# Patient Record
Sex: Male | Born: 1950 | Race: White | Hispanic: No | Marital: Married | Smoking: Never smoker
Health system: Southern US, Community
[De-identification: ages and names within clinical notes are randomized; demographics above are authoritative.]

## PROBLEM LIST (undated history)

## (undated) DIAGNOSIS — C801 Malignant (primary) neoplasm, unspecified: Secondary | ICD-10-CM

## (undated) DIAGNOSIS — E119 Type 2 diabetes mellitus without complications: Secondary | ICD-10-CM

## (undated) DIAGNOSIS — Z972 Presence of dental prosthetic device (complete) (partial): Secondary | ICD-10-CM

## (undated) DIAGNOSIS — I509 Heart failure, unspecified: Secondary | ICD-10-CM

## (undated) DIAGNOSIS — E7800 Pure hypercholesterolemia, unspecified: Secondary | ICD-10-CM

## (undated) DIAGNOSIS — I208 Other forms of angina pectoris: Secondary | ICD-10-CM

## (undated) DIAGNOSIS — G629 Polyneuropathy, unspecified: Secondary | ICD-10-CM

## (undated) DIAGNOSIS — I2089 Other forms of angina pectoris: Secondary | ICD-10-CM

## (undated) DIAGNOSIS — E039 Hypothyroidism, unspecified: Secondary | ICD-10-CM

## (undated) DIAGNOSIS — I10 Essential (primary) hypertension: Secondary | ICD-10-CM

## (undated) DIAGNOSIS — I4892 Unspecified atrial flutter: Secondary | ICD-10-CM

## (undated) DIAGNOSIS — R519 Headache, unspecified: Secondary | ICD-10-CM

## (undated) DIAGNOSIS — G4730 Sleep apnea, unspecified: Secondary | ICD-10-CM

## (undated) DIAGNOSIS — I219 Acute myocardial infarction, unspecified: Secondary | ICD-10-CM

## (undated) DIAGNOSIS — I2510 Atherosclerotic heart disease of native coronary artery without angina pectoris: Secondary | ICD-10-CM

## (undated) DIAGNOSIS — I499 Cardiac arrhythmia, unspecified: Secondary | ICD-10-CM

## (undated) DIAGNOSIS — H9190 Unspecified hearing loss, unspecified ear: Secondary | ICD-10-CM

## (undated) DIAGNOSIS — K219 Gastro-esophageal reflux disease without esophagitis: Secondary | ICD-10-CM

## (undated) HISTORY — PX: TONSILLECTOMY: SUR1361

## (undated) HISTORY — PX: CHOLECYSTECTOMY: SHX55

## (undated) HISTORY — PX: APPENDECTOMY: SHX54

## (undated) HISTORY — PX: COLON SURGERY: SHX602

## (undated) HISTORY — PX: KNEE ARTHROSCOPY: SUR90

---
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
## 1992-02-12 DIAGNOSIS — C801 Malignant (primary) neoplasm, unspecified: Secondary | ICD-10-CM

## 1992-02-12 HISTORY — PX: COLON RESECTION: SHX5231

## 1992-02-12 HISTORY — DX: Malignant (primary) neoplasm, unspecified: C80.1

## 2005-02-11 DIAGNOSIS — I219 Acute myocardial infarction, unspecified: Secondary | ICD-10-CM

## 2005-02-11 HISTORY — DX: Acute myocardial infarction, unspecified: I21.9

## 2005-05-29 ENCOUNTER — Ambulatory Visit: Payer: Self-pay

## 2005-05-31 ENCOUNTER — Ambulatory Visit: Payer: Self-pay

## 2005-06-04 ENCOUNTER — Emergency Department: Payer: Self-pay | Admitting: Emergency Medicine

## 2005-06-25 ENCOUNTER — Ambulatory Visit: Payer: Self-pay | Admitting: Surgery

## 2005-10-18 ENCOUNTER — Ambulatory Visit (HOSPITAL_COMMUNITY): Admission: RE | Admit: 2005-10-18 | Discharge: 2005-10-18 | Payer: Self-pay | Admitting: Orthopedic Surgery

## 2007-01-22 ENCOUNTER — Inpatient Hospital Stay (HOSPITAL_COMMUNITY): Admission: EM | Admit: 2007-01-22 | Discharge: 2007-02-02 | Payer: Self-pay | Admitting: Emergency Medicine

## 2007-02-03 ENCOUNTER — Inpatient Hospital Stay (HOSPITAL_COMMUNITY): Admission: EM | Admit: 2007-02-03 | Discharge: 2007-02-16 | Payer: Self-pay | Admitting: Emergency Medicine

## 2008-09-10 IMAGING — CR DG SMALL BOWEL
8 series · 8 of 8 positions shown · non-contrast
Comparison: none

CLINICAL DATA: 56-year-old male with history of nausea and vomiting, and distended bowel loops.  Prior abdominal surgery and partial bowel resection.
SMALL BOWEL STUDY:

[view not recorded (1 of 8)]
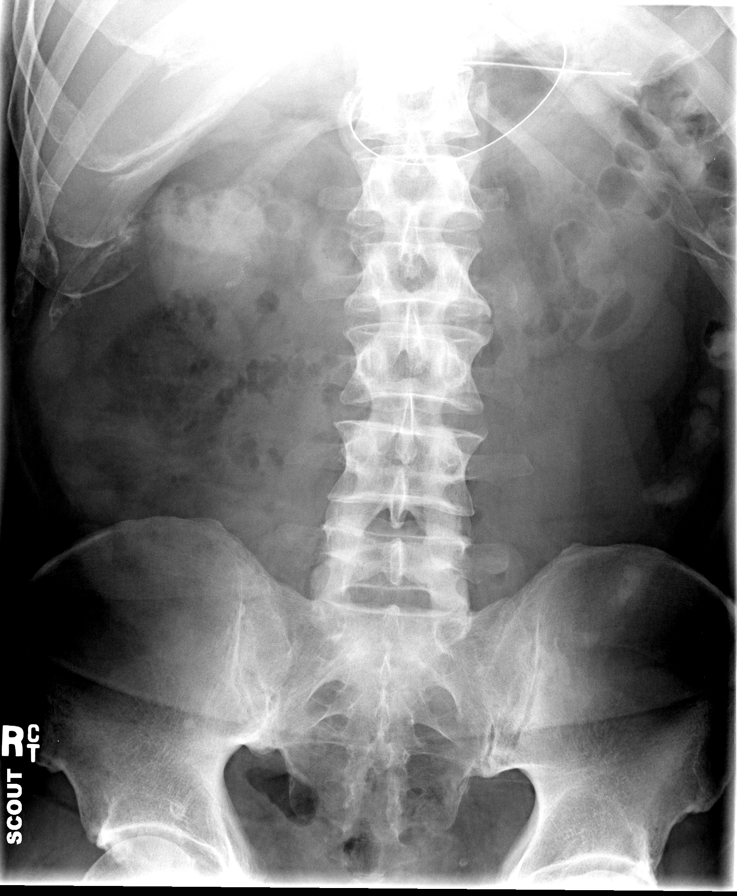

[view not recorded (2 of 8)]
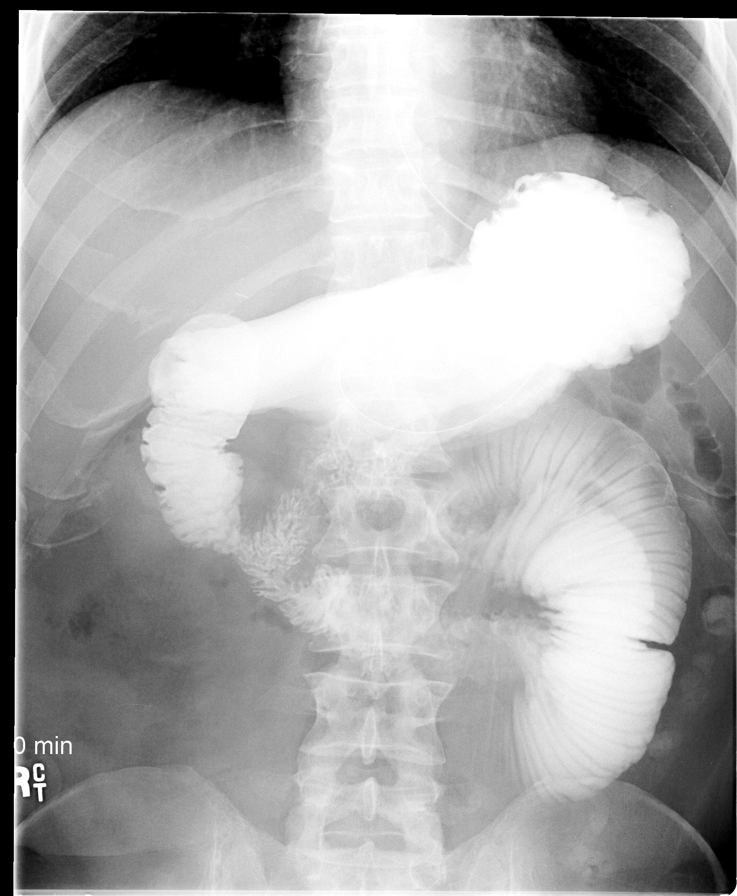

[view not recorded (3 of 8)]
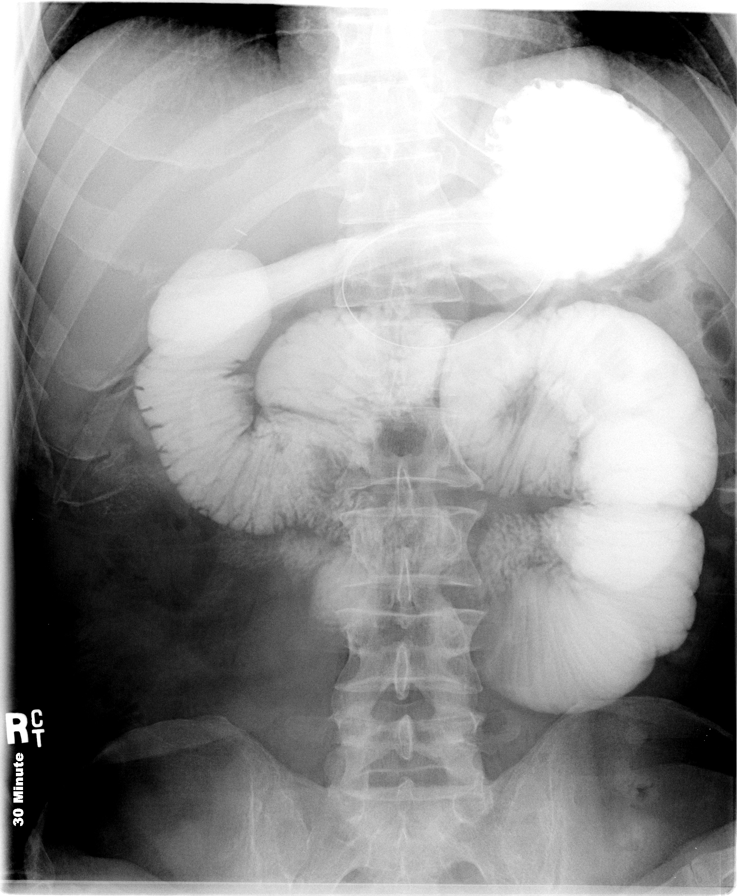

[view not recorded (4 of 8)]
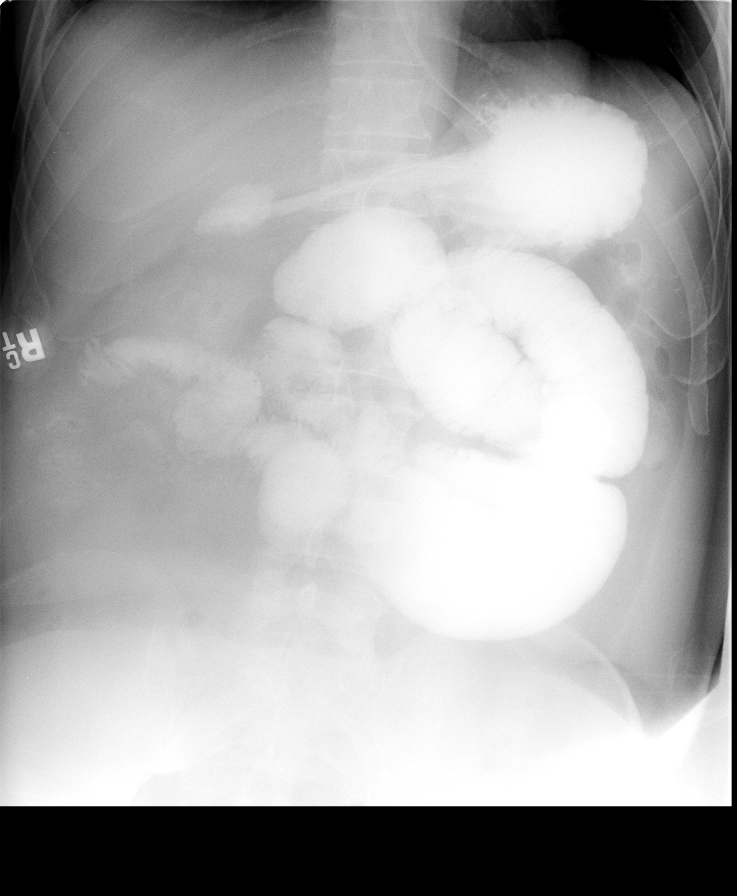

[view not recorded (5 of 8)]
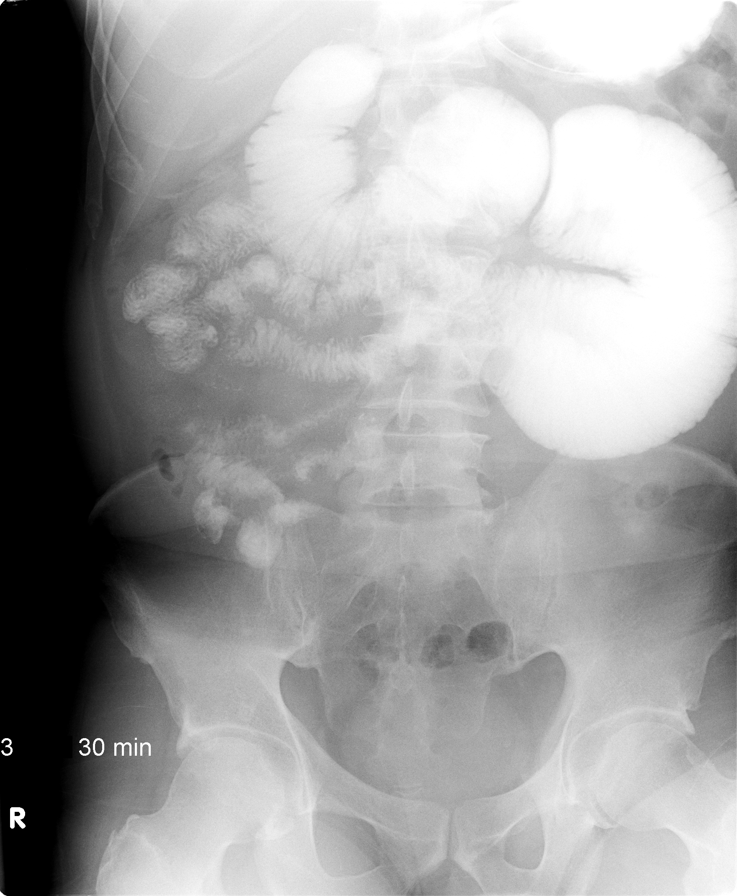

[view not recorded (6 of 8)]
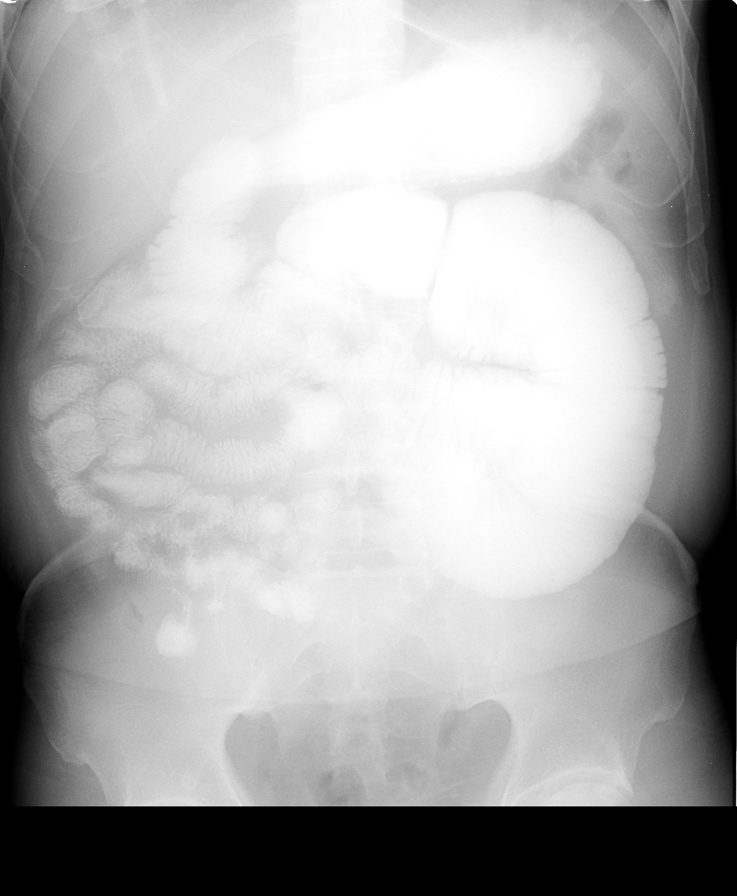

[view not recorded (7 of 8)]
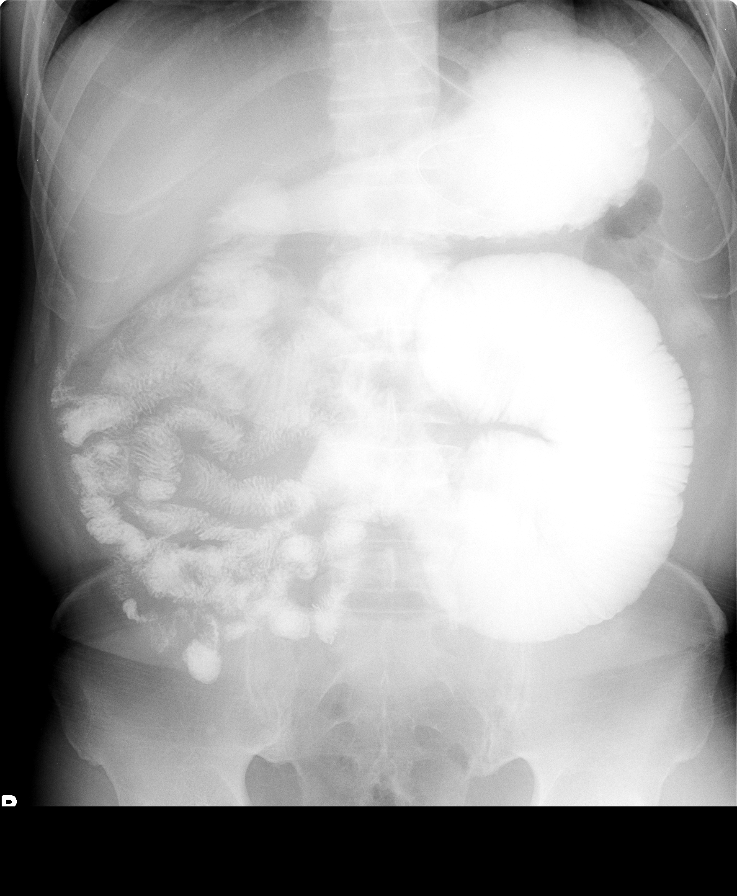

[view not recorded (8 of 8)]
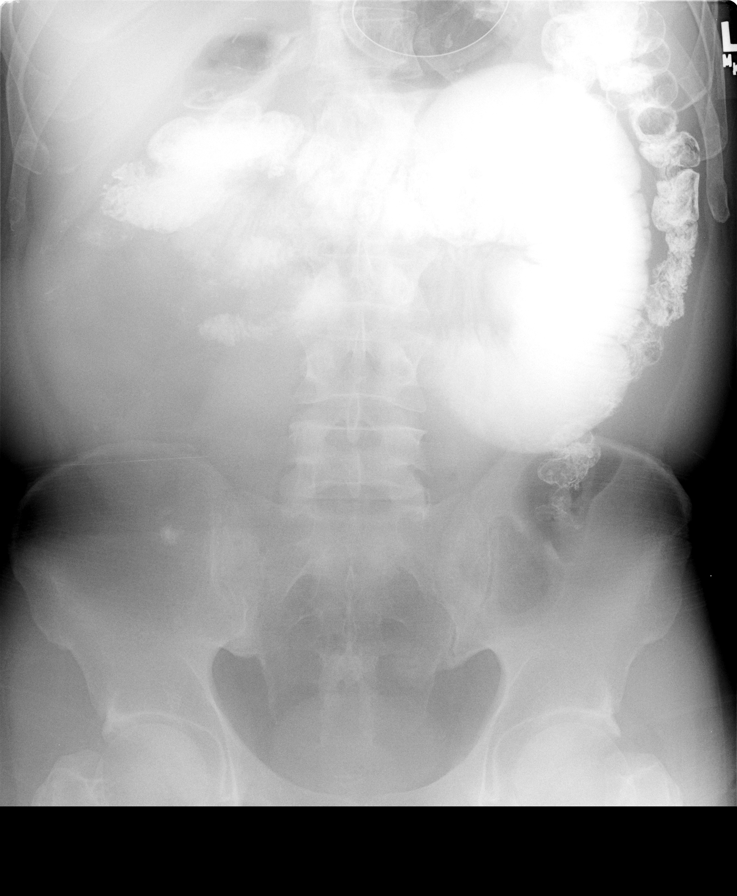

[8 of 8 positions shown; findings below may reference images not displayed]

FINDINGS: Combination of thin barium and Enterovue were administered through the patient?s NG tube as the patient was not able to tolerate oral intake.  The stomach is unremarkable.  There are dilated loops of proximal small bowel.  Slow transit through the small bowel noted but normal caliber mid and distal small bowel is present.  This is compatible with a partial small bowel obstruction.  Small amount of contrast reaches the colon by the 7-hour film.   An additional portable abdomen will be obtained at 11-hour interval.
IMPRESSION: Dilated proximal small bowel with collapsed mid and distal small bowel loops compatible with partial small bowel obstruction.

## 2008-12-16 ENCOUNTER — Inpatient Hospital Stay (HOSPITAL_COMMUNITY): Admission: EM | Admit: 2008-12-16 | Discharge: 2008-12-19 | Payer: Self-pay | Admitting: Internal Medicine

## 2008-12-16 ENCOUNTER — Encounter: Payer: Self-pay | Admitting: Emergency Medicine

## 2009-09-08 ENCOUNTER — Encounter: Payer: Self-pay | Admitting: Emergency Medicine

## 2009-09-27 ENCOUNTER — Encounter (HOSPITAL_COMMUNITY): Admission: RE | Admit: 2009-09-27 | Discharge: 2009-10-18 | Payer: Self-pay | Admitting: Cardiology

## 2010-01-01 ENCOUNTER — Encounter (HOSPITAL_COMMUNITY)
Admission: RE | Admit: 2010-01-01 | Discharge: 2010-03-13 | Payer: Self-pay | Source: Home / Self Care | Attending: Cardiology | Admitting: Cardiology

## 2010-01-18 ENCOUNTER — Inpatient Hospital Stay (HOSPITAL_COMMUNITY): Admission: EM | Admit: 2010-01-18 | Discharge: 2009-09-12 | Payer: Self-pay | Admitting: Cardiology

## 2010-04-27 LAB — CBC
HCT: 42.4 % (ref 39.0–52.0)
HCT: 44.4 % (ref 39.0–52.0)
Hemoglobin: 15.3 g/dL (ref 13.0–17.0)
Hemoglobin: 15.4 g/dL (ref 13.0–17.0)
MCH: 30.8 pg (ref 26.0–34.0)
MCH: 31 pg (ref 26.0–34.0)
MCHC: 36.1 g/dL — ABNORMAL HIGH (ref 30.0–36.0)
MCV: 89.4 fL (ref 78.0–100.0)
Platelets: 132 10*3/uL — ABNORMAL LOW (ref 150–400)
RBC: 4.97 MIL/uL (ref 4.22–5.81)
RBC: 4.97 MIL/uL (ref 4.22–5.81)

## 2010-04-27 LAB — BASIC METABOLIC PANEL
CO2: 28 mEq/L (ref 19–32)
Chloride: 107 mEq/L (ref 96–112)
GFR calc Af Amer: >60 mL/min (ref >60)
Glucose, Bld: 155 mg/dL — ABNORMAL HIGH (ref 70–99)
Potassium: 4.3 mEq/L (ref 3.5–5.1)
Sodium: 140 mEq/L (ref 135–145)

## 2010-04-27 LAB — GLUCOSE, CAPILLARY
Glucose-Capillary: 146 mg/dL — ABNORMAL HIGH (ref 70–99)
Glucose-Capillary: 156 mg/dL — ABNORMAL HIGH (ref 70–99)
Glucose-Capillary: 162 mg/dL — ABNORMAL HIGH (ref 70–99)

## 2010-04-28 LAB — CBC
HCT: 40.5 % (ref 39.0–52.0)
HCT: 42.5 % (ref 39.0–52.0)
Hemoglobin: 13.5 g/dL (ref 13.0–17.0)
Hemoglobin: 14.5 g/dL (ref 13.0–17.0)
MCH: 30.2 pg (ref 26.0–34.0)
MCH: 30.6 pg (ref 26.0–34.0)
MCHC: 33.5 g/dL (ref 30.0–36.0)
MCHC: 34.2 g/dL (ref 30.0–36.0)
MCV: 88.3 fL (ref 78.0–100.0)
MCV: 89.4 fL (ref 78.0–100.0)
Platelets: 136 10*3/uL — ABNORMAL LOW (ref 150–400)
RBC: 5.06 MIL/uL (ref 4.22–5.81)
RBC: 4.48 MIL/uL (ref 4.22–5.81)
RDW: 13.8 % (ref 11.5–15.5)
RDW: 14.1 % (ref 11.5–15.5)
WBC: 4.8 10*3/uL (ref 4.0–10.5)
WBC: 6.1 10*3/uL (ref 4.0–10.5)
WBC: 6.7 10*3/uL (ref 4.0–10.5)

## 2010-04-28 LAB — COMPREHENSIVE METABOLIC PANEL
ALT: 26 U/L (ref 0–53)
AST: 84 U/L — ABNORMAL HIGH (ref 0–37)
Alkaline Phosphatase: 54 U/L (ref 39–117)
BUN: 16 mg/dL (ref 6–23)
CO2: 28 mEq/L (ref 19–32)
CO2: 27 mEq/L (ref 19–32)
Calcium: 9.1 mg/dL (ref 8.4–10.5)
Calcium: 8.9 mg/dL (ref 8.4–10.5)
Chloride: 99 mEq/L (ref 96–112)
Creatinine, Ser: 1.02 mg/dL (ref 0.4–1.5)
GFR calc Af Amer: >60 mL/min (ref >60)
GFR calc Af Amer: >60 mL/min (ref >60)
GFR calc non Af Amer: >60 mL/min (ref >60)
GFR calc non Af Amer: >60 mL/min (ref >60)
Potassium: 4.1 mEq/L (ref 3.5–5.1)
Sodium: 137 mEq/L (ref 135–145)
Total Bilirubin: 0.4 mg/dL (ref 0.3–1.2)

## 2010-04-28 LAB — CARDIAC PANEL(CRET KIN+CKTOT+MB+TROPI)
CK, MB: 109.7 ng/mL (ref 0.3–4.0)
Relative Index: 10.8 — ABNORMAL HIGH (ref 0.0–2.5)
Relative Index: 11.4 — ABNORMAL HIGH (ref 0.0–2.5)
Total CK: 847 U/L — ABNORMAL HIGH (ref 7–232)
Troponin I: 13.58 ng/mL (ref 0.00–0.06)

## 2010-04-28 LAB — DIFFERENTIAL
Basophils Absolute: 0 10*3/uL (ref 0.0–0.1)
Lymphocytes Relative: 24 % (ref 12–46)
Lymphs Abs: 1.2 10*3/uL (ref 0.7–4.0)
Neutro Abs: 2.8 10*3/uL (ref 1.7–7.7)

## 2010-04-28 LAB — PROTIME-INR
INR: 1.12 (ref 0.00–1.49)
INR: 1.18 (ref 0.00–1.49)
Prothrombin Time: 14.3 seconds (ref 11.6–15.2)

## 2010-04-28 LAB — GLUCOSE, CAPILLARY
Glucose-Capillary: 252 mg/dL — ABNORMAL HIGH (ref 70–99)
Glucose-Capillary: 118 mg/dL — ABNORMAL HIGH (ref 70–99)
Glucose-Capillary: 173 mg/dL — ABNORMAL HIGH (ref 70–99)
Glucose-Capillary: 185 mg/dL — ABNORMAL HIGH (ref 70–99)

## 2010-04-28 LAB — APTT
aPTT: 38 seconds — ABNORMAL HIGH (ref 24–37)
aPTT: 46 seconds — ABNORMAL HIGH (ref 24–37)

## 2010-04-28 LAB — POCT CARDIAC MARKERS
CKMB, poc: 1.8 ng/mL (ref 1.0–8.0)
Myoglobin, poc: 131 ng/mL (ref 12–200)

## 2010-04-28 LAB — TROPONIN I: Troponin I: 0.02 ng/mL (ref 0.00–0.06)

## 2010-04-28 LAB — CK TOTAL AND CKMB (NOT AT ARMC)
Relative Index: 1.8 (ref 0.0–2.5)
Total CK: 216 U/L (ref 7–232)

## 2010-04-28 LAB — LIPID PANEL
HDL: 32 mg/dL — ABNORMAL LOW (ref >39)
LDL Cholesterol: UNDETERMINED mg/dL (ref 0–99)
Triglycerides: 410 mg/dL — ABNORMAL HIGH (ref <150)
VLDL: UNDETERMINED mg/dL (ref 0–40)

## 2010-04-28 LAB — HEPARIN LEVEL (UNFRACTIONATED): Heparin Unfractionated: 0.56 IU/mL (ref 0.30–0.70)

## 2010-04-28 LAB — MRSA PCR SCREENING: MRSA by PCR: NEGATIVE

## 2010-05-16 LAB — URINALYSIS, ROUTINE W REFLEX MICROSCOPIC
Bilirubin Urine: NEGATIVE
Glucose, UA: NEGATIVE mg/dL
Nitrite: NEGATIVE
Specific Gravity, Urine: 1.025 (ref 1.005–1.030)
pH: 6 (ref 5.0–8.0)

## 2010-05-16 LAB — DIFFERENTIAL
Basophils Relative: 0 % (ref 0–1)
Eosinophils Absolute: 0 10*3/uL (ref 0.0–0.7)
Neutrophils Relative %: 86 % — ABNORMAL HIGH (ref 43–77)

## 2010-05-16 LAB — COMPREHENSIVE METABOLIC PANEL
ALT: 26 U/L (ref 0–53)
Alkaline Phosphatase: 65 U/L (ref 39–117)
CO2: 25 mEq/L (ref 19–32)
GFR calc non Af Amer: >60 mL/min (ref >60)
Glucose, Bld: 181 mg/dL — ABNORMAL HIGH (ref 70–99)
Potassium: 3.9 mEq/L (ref 3.5–5.1)
Sodium: 136 mEq/L (ref 135–145)
Total Bilirubin: 0.7 mg/dL (ref 0.3–1.2)

## 2010-05-16 LAB — BASIC METABOLIC PANEL
GFR calc non Af Amer: >60 mL/min (ref >60)
Glucose, Bld: 140 mg/dL — ABNORMAL HIGH (ref 70–99)
Potassium: 3.7 mEq/L (ref 3.5–5.1)
Sodium: 137 mEq/L (ref 135–145)

## 2010-05-16 LAB — GLUCOSE, CAPILLARY
Glucose-Capillary: 108 mg/dL — ABNORMAL HIGH (ref 70–99)
Glucose-Capillary: 109 mg/dL — ABNORMAL HIGH (ref 70–99)
Glucose-Capillary: 114 mg/dL — ABNORMAL HIGH (ref 70–99)
Glucose-Capillary: 120 mg/dL — ABNORMAL HIGH (ref 70–99)
Glucose-Capillary: 121 mg/dL — ABNORMAL HIGH (ref 70–99)
Glucose-Capillary: 128 mg/dL — ABNORMAL HIGH (ref 70–99)
Glucose-Capillary: 138 mg/dL — ABNORMAL HIGH (ref 70–99)
Glucose-Capillary: 155 mg/dL — ABNORMAL HIGH (ref 70–99)
Glucose-Capillary: 187 mg/dL — ABNORMAL HIGH (ref 70–99)

## 2010-05-16 LAB — LIPASE, BLOOD: Lipase: 23 U/L (ref 11–59)

## 2010-05-16 LAB — CBC
HCT: 40.8 % (ref 39.0–52.0)
Hemoglobin: 16.3 g/dL (ref 13.0–17.0)
Hemoglobin: 14.4 g/dL (ref 13.0–17.0)
RBC: 5.22 MIL/uL (ref 4.22–5.81)
RBC: 4.54 MIL/uL (ref 4.22–5.81)
RDW: 13.6 % (ref 11.5–15.5)
WBC: 6.8 10*3/uL (ref 4.0–10.5)

## 2010-06-26 NOTE — H&P (Signed)
NAMEJOHNAVON, MCCLAFFERTY                 ACCOUNT NO.:  [id number]   MEDICAL RECORD NO.:  [id number]          PATIENT TYPE:  INP   LOCATION:  A302                          FACILITY:  [facility]   PHYSICIAN:  Tilford Pillar, MD      DATE OF BIRTH:  06-28-1950   DATE OF ADMISSION:  01/22/2007  DATE OF DISCHARGE:  LH                              HISTORY & PHYSICAL   ADMITTING DIAGNOSES:  Abdominal pain, nausea and vomiting.   HISTORY OF PRESENT ILLNESS:  The patient is a 60 year old male with a  history of diabetes mellitus, hypothyroidism, and a history of leukemia  at which time he had a previous bowel perforation which appears to be  colonic in nature which required operative resection and intervention.  This was approximately 14 years ago.  He has had no difficulties or  symptomatology since then.  He does state he had an episode of colicky  abdominal pain recently but this improved spontaneously.  Today, he  states that this morning he has had increasing insidious  onset of  crampy diffuse abdominal pain which progressed and became overt nausea  and vomiting.  This was not bloody.  He had additional crampy abdominal  pain and this was not resolving.  He did say he had a small bowel  movement this morning but it was significantly smaller in volume than  normal.  He denied any hematochezia or melena with the bowel movement.  He denies any fever or chills.   PAST MEDICAL HISTORY:  1. Diabetes mellitus, type 2.  2. Hypothyroidism.  3. Hypercholesterolemia.   PAST SURGICAL HISTORY:  1. Appendectomy.  2. Bowel resection following perforation.  3. Cholecystectomy.  4. Prior knee surgery.   MEDICATIONS:  1. Metformin 500 mg p.o. b.i.d.  2. Synthroid 0.125 mcg p.o. daily.  3. Additionally, he states he is on two hypercholesterolemia      medications, unsure of the names.   ALLERGIES:  1. PENICILLIN.  2. REGLAN.  3. COMPAZINE.  4. MORPHINE.  5. NIACIN.  6. SULFA MEDICATIONS.   SOCIAL  HISTORY:  No tobacco use, no alcohol use, no recreational drug  use.   REVIEW OF SYSTEMS:  No recent weight loss, weight gain.  No headaches.  No vision changes.  No shortness of breath.  No chest pain.  GI:  As per  above.  SKIN:  No rashes.  The patient states he has had a recent sinus  infection which was treated with Levaquin, which also was covering an  upper respiratory tract infection that the patient was recently  diagnosed with.   PHYSICAL EXAMINATION:  VITAL SIGNS:  He is afebrile.  Vital signs are  stable in the emergency department.  Not tachycardic.  GENERAL:  He is in no acute distress.  He is alert and oriented x3.  HEENT:  Pupils are equal, round, and reactive.  Extraocular movements  are intact.  No conjunctival pallor is noted.  NECK:  No cervical lymphadenopathy.  Trachea is midline.  PULMONARY:  He has unlabored respirations.  Clear to auscultation.  CARDIOVASCULAR:  Regular  rate and rhythm.  No murmurs, gallops, or rubs  are apparent.  Radial pulses 2 plus bilaterally.  ABDOMEN:  Abdomen is soft, flat.  He has a mild diffuse tenderness.  No  peritoneal signs.  The tenderness is greater in the lower quadrants more  so than the upper quadrants.  He has no hernias appreciated.  No masses.  EXTREMITIES:  Warm and dry.   PERTINENT LABORATORY AND RADIOGRAPHIC STUDIES:  CBC:  White blood cell  count 10.4, hemoglobin 17.2, hematocrit 49.9, platelets 229.  Basic  metabolic panel:  Sodium 135, potassium 4.5, chloride 92, bicarb 26, BUN  14, creatinine 1.13, blood glucose 234.  Urinalysis is negative (normal  limits).  CT of the abdomen and pelvis demonstrates no free air, no free  fluid.  He does have proximal small bowel dilatation.  Distal bowel is  decompressed.  No masses are apparent.   ASSESSMENT:  Partial small bowel obstruction.   PLAN:  As the patient has had bowel function this morning, this a  partial small bowel obstruction.  He will continued to be monitored  with  bowel rest, NG tube by decompression, keep him on an NPO status and he  will be evaluated closely for resolution of his symptoms.  Signs and  symptoms of worsening including increasing abdominal pain, increasing  nausea and vomiting, fever, or increasing white count were discussed  with the patient as reasons to go the operating room for exploratory  laparotomy with expected lysis of adhesions possible bowel resection.  However, at this time we will continue to monitor him and manage him on  a conservative basis.  The patient is comfortable with this plan and we  will continue to monitor the patient as an inpatient in [facility].      Tilford Pillar, MD  Electronically Signed     BZ/MEDQ  D:  01/22/2007  T:  01/23/2007  Job:  [id number]   cc:   Ninfa Linden, Dr.

## 2010-06-26 NOTE — H&P (Signed)
Jimmy Barry, Jimmy Barry                 ACCOUNT NO.:  [id number]   MEDICAL RECORD NO.:  [id number]          PATIENT TYPE:  INP   LOCATION:  A201                          FACILITY:  [facility]   PHYSICIAN:  Tilford Pillar, MD      DATE OF BIRTH:  01/23/1951   DATE OF ADMISSION:  02/03/2007  DATE OF DISCHARGE:  LH                              HISTORY & PHYSICAL   CHIEF COMPLAINT:  Nausea and vomiting.   HISTORY OF PRESENT ILLNESS:  The patient is an unfortunate 60 year old  male with history of hypothyroidism, diabetes mellitus type 2, and  hypercholesterolemia, who was discharged just a little over 24 hours  prior after extensive lysis of adhesions for a partial small bowel  obstruction. At this time, he returns for symptomatology somewhat to his  previous admission with nausea and vomiting and decreased flatus or  bowel movement. He states he did have a bowel movement, which was mostly  liquid this morning. However, has had increasing abdominal distention  since that time. He has described it as diffuse, crampy abdominal pain  with no revealing features. No fevers and chills. No drainage from his  incision.   PAST MEDICAL HISTORY:  1. Hypothyroidism.  2. Diabetes mellitus type 2.  3. Hypercholesterolemia.   PAST SURGICAL HISTORY:  1. Appendectomy.  2. Colon resection.  3. Lysis of adhesion on last admission. This was the only surgery      performed on the last admission.   MEDICATIONS:  Synthroid, Metformin, and he is on 2 medications for  hypercholesterolemia.   ALLERGIES:  PENICILLIN, REGLAN, COMPAZINE, MORPHINE, NIACIN.   SOCIAL HISTORY:  No tobacco, no alcohol use.   REVIEW OF SYSTEMS:  Unremarkable.   PHYSICAL EXAMINATION:  GENERAL:  No acute distress. He is alert and  oriented x3.  HEENT:  Pupils are equal, round, and reactive. Extraocular movements are  intact. No cervical lymphadenopathy is apparent.  PULMONARY:  Clear to auscultation. He has unlabored respirations.  CARDIOVASCULAR:  Regular rate and rhythm. No murmur, rub, or gallop  apparent.  ABDOMEN:  Diminished bowel sounds. He has a soft, moderately distended  abdomen. Mild diffuse tenderness on palpation. No peritoneal signs are  appreciated. No hernias or masses are apparent. Incision is intact.  Staples are present. No erythema is noted around the incision. No  discharge is noted.   LABORATORY DATA:  White blood cell count 9.8. Hemoglobin and hematocrit  13.8 and 40.5. Platelets 314,000. Basic metabolic panel:  Sodium 129,  potassium 3.9, chloride 296, bicarb 23, BUN 4, creatinine 1.05, blood  sugars 202, calcium 8.1.   CT of the abdomen and pelvis demonstrated no free air. There was a small  collection of mesenteric fluid in the right lower quadrant. There are  several loops of edematous loops of small bowel within the right lower  quadrant. No evidence of any transition point. No evidence of any  abscess. Otherwise, unremarkable CT evaluation of the abdomen.   ASSESSMENT/PLAN:  I suspect continued postoperative ileus at this time  and it appears that the patient continues to have symptoms consistent  with a persistent ileus. No evidence of a true bowel obstruction seen on  the CT evaluation and as the patient has had continued bowel function  although minimal, over the last 24 to 48 hours, especially within the  last 12 hours, it is advised that he be re-admitted for continued bowel  rest. He will be resumed on intravenous fluid hydration. It was  discussed that should he continue to have increased nausea, refractory  to Phenergan or Zofran, that it was recommended that an NG tube be  replaced for gastric decompression. At this point, the narcotic pain  medications will be minimized to minimize the chance of narcotic ileus.  At this time, he will be admitted for continued monitoring and  observation.      Tilford Pillar, MD  Electronically Signed     BZ/MEDQ  D:  02/03/2007  T:   02/03/2007  Job:  [id number]

## 2010-06-26 NOTE — Discharge Summary (Signed)
Jimmy Barry, Jimmy Barry                 ACCOUNT NO.:  [id number]   MEDICAL RECORD NO.:  [id number]          PATIENT TYPE:  INP   LOCATION:  A210                          FACILITY:  [facility]   PHYSICIAN:  Tilford Pillar, MD      DATE OF BIRTH:  [birth year]   DATE OF ADMISSION:  01/22/2007  DATE OF DISCHARGE:  12/22/2008LH                               DISCHARGE SUMMARY   ADMISSION DIAGNOSIS:  Small bowel obstruction.   DISCHARGE DIAGNOSES:  1. Status post small bowel obstruction requiring intraoperative lysis      of adhesions.  2. Diabetes mellitus, type 2.  3. Hypothyroidism.  4. Hypercholesterolemia.   ADMITTING SURGEON:  Tilford Pillar, M.D.   PROCEDURE:  Exploratory laparotomy with extensive enterolysis on  January 26, 2007.   DISPOSITION:  To home.   BRIEF HISTORY AND PHYSICAL:  Please see admission history and physical  for the complete H&P.  The patient is a 60 year old male with multiple  prior operations who presented to [facility] with increasing  nausea and vomiting and abdominal pain.  During evaluation in the  emergency department, it was determined that the patient likely had a  partial small bowel obstruction as he did have bowel function on the  morning of his admission.  He was admitted for conservative management  and continued monitoring.   HOSPITAL COURSE:  The patient was admitted on January 22, 2007.  It was  discussed with the patient that conservative management would be  undertaken, unless he had increasing pain, white count, fever, or no  significant progression of his symptomatology.  He continued to have  intermittent episodes of flatus, however, no bowel function during his  first couple of hospital days.  He was continued on IV fluid hydration  and NG tube gastric decompression.  On hospital day #3, he complained of  no episodes of flatus.  His bowel sounds were absent and his abdomen  remained distended despite having NG tube decompression.  At  this point  it was discussed with the patient the need for exploratory laparotomy  with suspected extensive lysis of adhesions.  He was taken to the  operating room on January 26, 2007.  He underwent the above mentioned  exploratory laparotomy with extensive lysis of adhesions.  He tolerated  the procedure well.  He was monitored following this on the telemetry  floor due to some cardiac changes noted on induction of anesthetic.  However, he continued to be asymptomatic without any notable changes in  his telemetry monitoring.  At this point he was continued on conservative management with limited  p.o.  He did have slow return of bowel function with passing of flatus,  decreasing nausea.  He was slowly started on clear liquids.  This was  advanced to full liquids.  He continued to have flatus but no bowel  movement.  He did eventually progressed to bowel movements at which  point he was advanced to a regular diet.  He tolerated this well.  Plans  were initiated for discharge to home following his next meal.  DISCHARGE INSTRUCTIONS:  1. The patient may return to function as tolerated.  He is limited to      activities with no heavy lifting over 20 pounds for the next 3      weeks.  2. The patient was instructed he may shower.  He was instructed not to      soak the incision.  3. The patient is to follow up in 10 days in my office for staple      removal.  4. He is instructed not to drive while taking pain medications.   DISCHARGE MEDICATIONS:  1. Lortab 5/500, one to two orally q.4 h. p.r.n. pain.  Additionally,      the patient is to continue all previously prescribed home      medications including:  2. __________  60 mg p.o. b.i.d.  3. Simvastatin p.o. daily.  4. Metformin 500 mg p.o. b.i.d.  5. Synthroid 0.0125 mcg p.o. daily.      Tilford Pillar, MD  Electronically Signed     BZ/MEDQ  D:  02/02/2007  T:  02/02/2007  Job:  [id number]   cc:   Ninfa Linden, Dr.

## 2010-06-26 NOTE — Op Note (Signed)
NAMEJAXTON, CASALE                 ACCOUNT NO.:  [id number]   MEDICAL RECORD NO.:  [id number]          PATIENT TYPE:  INP   LOCATION:  A210                          FACILITY:  [facility]   PHYSICIAN:  Tilford Pillar, MD      DATE OF BIRTH:  1950/06/09   DATE OF PROCEDURE:  01/26/2007  DATE OF DISCHARGE:                               OPERATIVE REPORT   PREOPERATIVE DIAGNOSIS:  Small bowel obstruction.   POSTOPERATIVE DIAGNOSIS:  Small bowel obstruction.   PROCEDURE:  1. Exploratory laparotomy.  2. Extensive enterolysis.   SURGEON:  Tilford Pillar, MD.   ANESTHESIA:  General endotracheal.   ESTIMATED BLOOD LOSS:  Less than 100 milliliters.   SPECIMENS:  None.   INDICATIONS:  Patient is an unfortunate 60 year old male who presented  to [facility] with signs and symptoms consistent with a partial  small bowel obstruction.  He was admitted for continued conservative  management. He has had a history of extensive intraabdominal operations  including an open appendectomy and a bowel resection secondarily to a  prior perforation.  Due to this, the patient was continued on  conservative management in hopes that the obstruction would resolved  with continued conservative management.  However, on hospital day number  4, he continued to have no resolution of his symptoms.  Continued with  high outputs from his nasogastric tube and continued to have no bowel  function, no flatus, no bowel movement.  Therefore, it was discussed  with the patient who was advised at this time to undergo exploratory  laparotomy with likely lysis of adhesions as well as the possibility of  a bowel resection with plans for anastomosis if needed.  The patient's  questions and concerns were addressed, and the patient was consented for  the planned procedure.   DESCRIPTION OF OPERATION:  Patient was taken to the operating room and  was placed in a supine position on the operating table, at which time  general  anesthetic was administered.  Once the patient was asleep, he  was endotracheally intubated by anesthesia.  A Foley catheter was then  placed using sterile technique by the operating room staff.  Then his  abdomen was prepped and draped in the usual fashion.  At this point, an  elliptical incision was created around his prior midline incision. This  was started inferiorly and brought approximately to the mid portion of  his abdomen by his umbilicus.  The inferior portion of the scar was  excised.  Dissection carried down through subcuticular tissue and scar  tissue was carried out using electrocautery.  Vision of the peritoneum  was obtained inferiorly as this was a virgin area of his abdomen.  Upon  entrance into the peritoneal cavity, there was a significant amount of  adhesions noted throughout with the majority on the right against the  right anterior abdominal wall. Therefore initial enterolysis was carried  out along the left anterior abdominal wall allowing the adhesions to be  brought down in a sweeping motion from the left to the right.  Continued  dissection was carried  out underneath the midline portion of the  incision. Due again to the significant amount of adhesions, the incision  was further elongated superiorly to allow better exposure and again  better assistance with lysis of the adhesions. At this point, the small  bowel was noted to be primarily tethered in the right lower quadrant.  Multiple interloop adhesions were encountered.  These were taken down  with combination of sharp, blunt and electrocautery dissection.  This  was continued until the bowel was freed .  No one particular area was  noted to be the clear transition point.  Therefore it was felt that the  majority of the adhesions needed to be taken down in order to prevent a  return trip to the operating room.  At this point, the small bowel was  for the most part free from adhesions from the ligament of Treitz  to the  terminal ileum.  One area of suspected serosal tear was imbricated with  2-0 silk sutures.  The resulting lumen was widely patent.  No additional  areas of serosal tear or bowel injury were noted.  The bowel was run in  its entirety from the ligament of Treitz to the terminal ileum twice to  ensure no evidence of any injury or hemorrhage.  Hemostasis was  excellent.  At this point, the abdomen was irrigated with copious amount  of sterile warm saline.  All lap sponges were removed.  Hemostasis was  excellent.  At this point, attention was turned to closure.  A limited  amount of omentum was still present within the abdomen.  This was placed  underneath the incision during the time of closure.   An O-looped Novofil suture was then utilized to reapproximate the  anterior abdominal wall fascia.  It was started superiorly and brought  to the mid point.  A second O-looped Novofil suture was then started  superior and was brought to the first.  These were secured to each other  and then tucked into the subcuticular tissue.  The wound was again  irrigated, and skin staples were utilized to reapproximate the skin  edges after excising another area of the previous scar.  At this point,  the skin edges were reapproximated with skin staples.  Skin was washed  and dried with a moist dry towel.  Sterile dressings were placed.  Drapes removed.  Patient was allowed to come out of general anesthetic.  He was transferred back to a regular hospital bed in stable condition.  At the conclusion of the procedure,  all instrument, sponge and needle  counts were correct.  The patient tolerated the procedure well.      Tilford Pillar, MD  Electronically Signed     BZ/MEDQ  D:  01/26/2007  T:  01/27/2007  Job:  [id number]   cc:   Patient's primary care physician

## 2010-06-29 NOTE — Discharge Summary (Signed)
NAMECALEL, PISARSKI                 ACCOUNT NO.:  [id number]   MEDICAL RECORD NO.:  [id number]          PATIENT TYPE:  INP   LOCATION:  A330                          FACILITY:  [facility]   PHYSICIAN:  Tilford Pillar, MD      DATE OF BIRTH:  1950/07/13   DATE OF ADMISSION:  02/03/2007  DATE OF DISCHARGE:  01/05/2009LH                               DISCHARGE SUMMARY   ADMISSION DIAGNOSIS:  Small bowel obstruction.   DISCHARGE DIAGNOSES:  1. Small bowel obstruction.  2. Hypothyroidism.  3. Diabetes mellitus type 2.  4. Hypercholesterolemia.   PROCEDURES:  None.   BRIEF HISTORY OF PRESENT ILLNESS:  Please see the admission History and  Physical for complete H&P.  The patient is a 60 year old male who is  recently discharged within the last just over 24 hours for status post  exploratory laparotomy and lysis of adhesions for a bowel obstruction.  He had begun to have resumption of bowel function, and was tolerating a  diet and was discharged.  Within the 24 hours he again developed nausea  and vomiting and decreased flatus, and did not have any additional bowel  movements and similar symptoms as prior bowel obstruction had presented  back to the emergency department at which point additional radiographic  films were obtained again demonstrating suspicion of continued ileus  versus partial small bowel obstruction.   HOSPITAL COURSE:  The patient was admitted on February 03, 2007 for  management of suspected continued partial small bowel obstruction versus  persistent ileus from his prior lysis of adhesions.  He was continued on  NPO status with bowel rest, and was initiated on TPN for nutritional  support during this period.  He continued to have slow resolution with  continued upset and nausea, high NG output, and minimal bowel function.  As patient has continued to have slow progression an upper GI was  obtained with a small bowel followthrough.  This did demonstrate  transition of all  contrast to the colon.  However, this was quite slow  in transition from the small bowel to the colon.  At this point it was  suspected there was still an additional area of stricture not evident on  the initial operation.  It was advised to the patient he either continue  conservative management or likely need for return to the operating room.  At this point the patient's family did wish for a second opinion, and  requested evaluation at [facility].  At this point general surgeon consultation was obtained at [facility], and  plans were made for transfer.  The patient and patient's family were  comfortable with this plan, and at this point patient was accepted in  transfer by [facility] for continued management of this bowel  obstruction.      Tilford Pillar, MD  Electronically Signed     BZ/MEDQ  D:  02/27/2007  T:  02/27/2007  Job:  [id number]   cc:   Jay Schlichter, M.D.

## 2010-11-01 LAB — COMPREHENSIVE METABOLIC PANEL
ALT: 16
ALT: 16
ALT: 17
ALT: 21
AST: 19
AST: 20
Albumin: 3.5
Albumin: 3.6
Alkaline Phosphatase: 95
Alkaline Phosphatase: 96
Alkaline Phosphatase: 96
BUN: 25 — ABNORMAL HIGH
BUN: 25 — ABNORMAL HIGH
CO2: 27
CO2: 27
CO2: 28
CO2: 29
Calcium: 8.9
Calcium: 9.1
Chloride: 102
Chloride: 97
Chloride: 98
Creatinine, Ser: 0.95
GFR calc Af Amer: >60
GFR calc non Af Amer: >60
GFR calc non Af Amer: >60
GFR calc non Af Amer: >60
GFR calc non Af Amer: >60
Glucose, Bld: 127 — ABNORMAL HIGH
Glucose, Bld: 149 — ABNORMAL HIGH
Glucose, Bld: 154 — ABNORMAL HIGH
Potassium: 4
Potassium: 4.1
Potassium: 4.1
Sodium: 131 — ABNORMAL LOW
Sodium: 133 — ABNORMAL LOW
Sodium: 135
Sodium: 136
Total Bilirubin: 0.5
Total Bilirubin: 0.5
Total Protein: 6.1
Total Protein: 6.1
Total Protein: 6.2
Total Protein: 6.6

## 2010-11-01 LAB — DIFFERENTIAL
Basophils Absolute: 0
Basophils Relative: 1
Basophils Relative: 1
Basophils Relative: 1
Basophils Relative: 2 — ABNORMAL HIGH
Eosinophils Absolute: 0.3
Eosinophils Absolute: 0.3
Eosinophils Absolute: 0.3
Eosinophils Absolute: 0.3
Eosinophils Absolute: 0.4
Eosinophils Relative: 4
Eosinophils Relative: 4
Eosinophils Relative: 4
Eosinophils Relative: 5
Lymphocytes Relative: 23
Lymphocytes Relative: 23
Lymphs Abs: 1.6
Lymphs Abs: 1.8
Lymphs Abs: 1.8
Monocytes Absolute: 0.8
Monocytes Absolute: 1
Monocytes Absolute: 1
Monocytes Relative: 10
Monocytes Relative: 10
Monocytes Relative: 11
Monocytes Relative: 13 — ABNORMAL HIGH
Monocytes Relative: 13 — ABNORMAL HIGH
Neutro Abs: 4.5
Neutro Abs: 5.1
Neutrophils Relative %: 59
Neutrophils Relative %: 63
Neutrophils Relative %: 64

## 2010-11-01 LAB — CBC
HCT: 44.5
HCT: 45.1
Hemoglobin: 14.9
Hemoglobin: 15.1
MCHC: 33.5
MCHC: 33.6
MCHC: 33.9
MCHC: 34.1
MCV: 86.8
MCV: 87.1
Platelets: 176
Platelets: 199
Platelets: 261
Platelets: 292
RBC: 5.16
RBC: 5.17
RDW: 14.3
RDW: 14.7
RDW: 14.7
WBC: 7.9

## 2010-11-01 LAB — MAGNESIUM
Magnesium: 2.4
Magnesium: 2.4
Magnesium: 2.4

## 2010-11-01 LAB — PHOSPHORUS
Phosphorus: 4.1
Phosphorus: 4.4

## 2010-11-01 LAB — PREALBUMIN: Prealbumin: 30.9

## 2010-11-01 LAB — TRIGLYCERIDES: Triglycerides: 152 — ABNORMAL HIGH

## 2010-11-16 LAB — COMPREHENSIVE METABOLIC PANEL
ALT: 12
ALT: 13
ALT: 18
AST: 11
AST: 11
AST: 13
AST: 16
AST: 19
Albumin: 2.5 — ABNORMAL LOW
Albumin: 2.7 — ABNORMAL LOW
Albumin: 2.7 — ABNORMAL LOW
Albumin: 2.9 — ABNORMAL LOW
Albumin: 3 — ABNORMAL LOW
Albumin: 3.4 — ABNORMAL LOW
Alkaline Phosphatase: 49
Alkaline Phosphatase: 50
Alkaline Phosphatase: 59
BUN: 12
BUN: 3 — ABNORMAL LOW
BUN: 5 — ABNORMAL LOW
BUN: 7
BUN: 9
CO2: 23
CO2: 25
CO2: 30
CO2: 30
Calcium: 7.8 — ABNORMAL LOW
Calcium: 7.9 — ABNORMAL LOW
Calcium: 8.1 — ABNORMAL LOW
Calcium: 8.2 — ABNORMAL LOW
Calcium: 8.4
Chloride: 101
Chloride: 102
Chloride: 106
Chloride: 107
Creatinine, Ser: 0.77
Creatinine, Ser: 0.88
Creatinine, Ser: 0.88
Creatinine, Ser: 0.89
GFR calc Af Amer: >60
GFR calc Af Amer: >60
GFR calc non Af Amer: >60
GFR calc non Af Amer: >60
GFR calc non Af Amer: >60
GFR calc non Af Amer: >60
Glucose, Bld: 171 — ABNORMAL HIGH
Glucose, Bld: 185 — ABNORMAL HIGH
Glucose, Bld: 190 — ABNORMAL HIGH
Glucose, Bld: 198 — ABNORMAL HIGH
Potassium: 3.5
Potassium: 3.8
Sodium: 133 — ABNORMAL LOW
Sodium: 133 — ABNORMAL LOW
Sodium: 136
Sodium: 138
Total Bilirubin: 0.4
Total Bilirubin: 0.5
Total Bilirubin: 0.9
Total Protein: 5.3 — ABNORMAL LOW
Total Protein: 5.3 — ABNORMAL LOW
Total Protein: 5.4 — ABNORMAL LOW
Total Protein: 5.6 — ABNORMAL LOW
Total Protein: 5.7 — ABNORMAL LOW
Total Protein: 6.4

## 2010-11-16 LAB — DIFFERENTIAL
Basophils Absolute: 0
Basophils Absolute: 0
Basophils Absolute: 0
Basophils Absolute: 0
Basophils Absolute: 0
Basophils Absolute: 0
Basophils Relative: 0
Basophils Relative: 0
Basophils Relative: 0
Basophils Relative: 1
Basophils Relative: 1
Eosinophils Absolute: 0.1 — ABNORMAL LOW
Eosinophils Absolute: 0.1 — ABNORMAL LOW
Eosinophils Absolute: 0.2
Eosinophils Absolute: 0.3
Eosinophils Absolute: 0.3
Eosinophils Absolute: 0.3
Eosinophils Relative: 1
Eosinophils Relative: 1
Eosinophils Relative: 2
Eosinophils Relative: 2
Eosinophils Relative: 3
Eosinophils Relative: 3
Eosinophils Relative: 4
Eosinophils Relative: 5
Eosinophils Relative: 5
Eosinophils Relative: 7 — ABNORMAL HIGH
Lymphocytes Relative: 10 — ABNORMAL LOW
Lymphocytes Relative: 11 — ABNORMAL LOW
Lymphocytes Relative: 13
Lymphocytes Relative: 16
Lymphocytes Relative: 18
Lymphocytes Relative: 19
Lymphocytes Relative: 19
Lymphocytes Relative: 23
Lymphocytes Relative: 23
Lymphs Abs: 0.7
Lymphs Abs: 0.8
Lymphs Abs: 0.8
Lymphs Abs: 1
Lymphs Abs: 1.2
Lymphs Abs: 1.2
Lymphs Abs: 1.2
Lymphs Abs: 1.2
Lymphs Abs: 1.5
Lymphs Abs: 1.5
Lymphs Abs: 1.6
Monocytes Absolute: 0.6
Monocytes Absolute: 0.6
Monocytes Absolute: 0.6
Monocytes Absolute: 0.7
Monocytes Absolute: 0.7
Monocytes Absolute: 0.8
Monocytes Absolute: 0.8
Monocytes Absolute: 0.9
Monocytes Absolute: 0.9
Monocytes Absolute: 1
Monocytes Relative: 10
Monocytes Relative: 11
Monocytes Relative: 11
Monocytes Relative: 12
Monocytes Relative: 8
Monocytes Relative: 9
Monocytes Relative: 9
Neutro Abs: 3.9
Neutro Abs: 4
Neutro Abs: 4.1
Neutro Abs: 4.5
Neutro Abs: 5.4
Neutro Abs: 5.5
Neutro Abs: 6.9
Neutrophils Relative %: 64
Neutrophils Relative %: 70
Neutrophils Relative %: 71
Neutrophils Relative %: 71
Neutrophils Relative %: 73

## 2010-11-16 LAB — PHOSPHORUS
Phosphorus: 2.2 — ABNORMAL LOW
Phosphorus: 2.5
Phosphorus: 2.6
Phosphorus: 3.2
Phosphorus: 3.9

## 2010-11-16 LAB — CBC
HCT: 36 — ABNORMAL LOW
HCT: 36.6 — ABNORMAL LOW
HCT: 37.3 — ABNORMAL LOW
HCT: 38.2 — ABNORMAL LOW
HCT: 38.2 — ABNORMAL LOW
HCT: 40
HCT: 40.5
HCT: 42.9
HCT: 44.2
HCT: 44.4
Hemoglobin: 12.2 — ABNORMAL LOW
Hemoglobin: 12.5 — ABNORMAL LOW
Hemoglobin: 12.8 — ABNORMAL LOW
Hemoglobin: 13.7
Hemoglobin: 13.7
Hemoglobin: 15.1
Hemoglobin: 15.2
MCHC: 33.8
MCHC: 33.9
MCHC: 33.9
MCHC: 34
MCHC: 34.1
MCHC: 34.3
MCHC: 34.3
MCHC: 34.3
MCV: 86.2
MCV: 86.4
MCV: 86.5
MCV: 87
MCV: 87.3
MCV: 87.8
MCV: 87.8
Platelets: 172
Platelets: 190
Platelets: 193
Platelets: 222
Platelets: 293
Platelets: 302
Platelets: 314
Platelets: 315
Platelets: 326
Platelets: 349
Platelets: 358
RBC: 4.1 — ABNORMAL LOW
RBC: 4.2 — ABNORMAL LOW
RBC: 4.33
RBC: 4.69
RBC: 4.92
RBC: 5.15
RDW: 13.7
RDW: 13.8
RDW: 13.9
RDW: 13.9
RDW: 13.9
RDW: 14.2
RDW: 14.4
RDW: 14.4
RDW: 14.4
WBC: 10
WBC: 10
WBC: 6.4
WBC: 6.4
WBC: 7.1
WBC: 7.2
WBC: 9.3

## 2010-11-16 LAB — BASIC METABOLIC PANEL
BUN: 11
BUN: 22
BUN: 6
BUN: 6
CO2: 22
CO2: 25
CO2: 28
Calcium: 8.2 — ABNORMAL LOW
Calcium: 8.4
Chloride: 102
Chloride: 105
Chloride: 106
Chloride: 96
GFR calc Af Amer: >60
GFR calc Af Amer: >60
GFR calc non Af Amer: >60
GFR calc non Af Amer: >60
GFR calc non Af Amer: >60
GFR calc non Af Amer: >60
Glucose, Bld: 131 — ABNORMAL HIGH
Glucose, Bld: 145 — ABNORMAL HIGH
Glucose, Bld: 155 — ABNORMAL HIGH
Glucose, Bld: 202 — ABNORMAL HIGH
Potassium: 3.7
Potassium: 3.9
Potassium: 4.2
Potassium: 4.4
Sodium: 135
Sodium: 140
Sodium: 140

## 2010-11-16 LAB — MAGNESIUM
Magnesium: 1.9
Magnesium: 1.9
Magnesium: 2.1
Magnesium: 2.5
Magnesium: 2.6 — ABNORMAL HIGH
Magnesium: 2.9 — ABNORMAL HIGH

## 2010-11-16 LAB — TRIGLYCERIDES: Triglycerides: 105

## 2010-11-16 LAB — HEPARIN LEVEL (UNFRACTIONATED): Heparin Unfractionated: 0.16 — ABNORMAL LOW

## 2010-11-19 LAB — CBC
HCT: 37.8 — ABNORMAL LOW
HCT: 49.9
Hemoglobin: 17.2 — ABNORMAL HIGH
MCHC: 34.3
MCHC: 34.5
MCV: 87.5
Platelets: 172
Platelets: 173
RBC: 4.71
RDW: 13.9
RDW: 14
WBC: 7.7

## 2010-11-19 LAB — DIFFERENTIAL
Basophils Absolute: 0
Basophils Absolute: 0
Basophils Relative: 0
Basophils Relative: 1
Basophils Relative: 1
Eosinophils Absolute: 0.3
Eosinophils Relative: 0
Eosinophils Relative: 4
Lymphocytes Relative: 11 — ABNORMAL LOW
Lymphocytes Relative: 22
Lymphs Abs: 1.2
Monocytes Absolute: 0.4
Monocytes Relative: 10
Neutro Abs: 5.2
Neutrophils Relative %: 68

## 2010-11-19 LAB — BASIC METABOLIC PANEL
BUN: 5 — ABNORMAL LOW
BUN: 8
CO2: 26
CO2: 29
Calcium: 8.1 — ABNORMAL LOW
Chloride: 92 — ABNORMAL LOW
Creatinine, Ser: 0.87
GFR calc Af Amer: >60
GFR calc non Af Amer: >60
GFR calc non Af Amer: >60
Glucose, Bld: 173 — ABNORMAL HIGH
Glucose, Bld: 254 — ABNORMAL HIGH
Potassium: 4.5
Potassium: 4.5
Sodium: 135

## 2010-11-19 LAB — PHOSPHORUS: Phosphorus: 2.8

## 2010-11-19 LAB — URINALYSIS, ROUTINE W REFLEX MICROSCOPIC
Bilirubin Urine: NEGATIVE
Ketones, ur: 15 — AB
Nitrite: NEGATIVE
pH: 6.5

## 2010-11-19 LAB — MAGNESIUM: Magnesium: 2.2

## 2011-02-25 ENCOUNTER — Other Ambulatory Visit: Payer: Self-pay | Admitting: Cardiology

## 2014-02-11 HISTORY — PX: CARDIAC CATHETERIZATION: SHX172

## 2015-02-12 HISTORY — PX: CORONARY ARTERY BYPASS GRAFT: SHX141

## 2018-03-14 DIAGNOSIS — I4892 Unspecified atrial flutter: Secondary | ICD-10-CM

## 2018-03-14 HISTORY — DX: Unspecified atrial flutter: I48.92

## 2018-10-30 ENCOUNTER — Other Ambulatory Visit: Payer: Self-pay | Admitting: Physician Assistant

## 2018-10-30 DIAGNOSIS — M1612 Unilateral primary osteoarthritis, left hip: Secondary | ICD-10-CM

## 2018-11-17 ENCOUNTER — Other Ambulatory Visit: Payer: Self-pay

## 2018-11-17 ENCOUNTER — Ambulatory Visit
Admission: RE | Admit: 2018-11-17 | Discharge: 2018-11-17 | Disposition: A | Discharge status: Home / Self Care | Payer: Self-pay | Source: Ambulatory Visit | Attending: Physician Assistant | Admitting: Physician Assistant

## 2018-11-17 DIAGNOSIS — M1612 Unilateral primary osteoarthritis, left hip: Secondary | ICD-10-CM

## 2018-11-17 MED ORDER — IOPAMIDOL (ISOVUE-M 200) INJECTION 41%
1.0000 mL | Freq: Once | INTRAMUSCULAR | Status: AC
  Administered 2018-11-17: 1 mL via INTRA_ARTICULAR

## 2018-11-17 MED ORDER — METHYLPREDNISOLONE ACETATE 40 MG/ML INJ SUSP (RADIOLOG
120.0000 mg | Freq: Once | INTRAMUSCULAR | Status: AC
  Administered 2018-11-17: 120 mg via INTRA_ARTICULAR

## 2018-11-17 NOTE — Discharge Instructions (Signed)

## 2018-11-30 ENCOUNTER — Encounter: Payer: Self-pay | Admitting: *Deleted

## 2018-11-30 ENCOUNTER — Other Ambulatory Visit: Payer: Self-pay

## 2018-12-01 ENCOUNTER — Encounter: Payer: Self-pay | Admitting: Anesthesiology

## 2018-12-04 ENCOUNTER — Other Ambulatory Visit: Payer: Self-pay

## 2018-12-08 ENCOUNTER — Ambulatory Visit: Admit: 2018-12-08 | Payer: Medicare HMO | Admitting: Ophthalmology

## 2018-12-08 HISTORY — DX: Type 2 diabetes mellitus without complications: E11.9

## 2018-12-08 HISTORY — DX: Unspecified hearing loss, unspecified ear: H91.90

## 2018-12-08 HISTORY — DX: Heart failure, unspecified: I50.9

## 2018-12-08 HISTORY — DX: Pure hypercholesterolemia, unspecified: E78.00

## 2018-12-08 HISTORY — DX: Atherosclerotic heart disease of native coronary artery without angina pectoris: I25.10

## 2018-12-08 HISTORY — DX: Sleep apnea, unspecified: G47.30

## 2018-12-08 HISTORY — DX: Polyneuropathy, unspecified: G62.9

## 2018-12-08 HISTORY — DX: Presence of dental prosthetic device (complete) (partial): Z97.2

## 2018-12-08 HISTORY — DX: Hypothyroidism, unspecified: E03.9

## 2018-12-08 HISTORY — DX: Headache, unspecified: R51.9

## 2018-12-08 HISTORY — DX: Gastro-esophageal reflux disease without esophagitis: K21.9

## 2018-12-08 HISTORY — DX: Essential (primary) hypertension: I10

## 2018-12-08 SURGERY — PHACOEMULSIFICATION, CATARACT, WITH IOL INSERTION
Anesthesia: Topical | Laterality: Right

## 2018-12-25 ENCOUNTER — Encounter: Payer: Self-pay | Admitting: *Deleted

## 2018-12-29 ENCOUNTER — Other Ambulatory Visit
Admission: RE | Admit: 2018-12-29 | Discharge: 2018-12-29 | Disposition: A | Discharge status: Home / Self Care | Payer: Medicare HMO | Source: Ambulatory Visit | Attending: Ophthalmology | Admitting: Ophthalmology

## 2018-12-29 ENCOUNTER — Other Ambulatory Visit: Payer: Self-pay

## 2018-12-29 DIAGNOSIS — Z20828 Contact with and (suspected) exposure to other viral communicable diseases: Secondary | ICD-10-CM | POA: Insufficient documentation

## 2018-12-29 DIAGNOSIS — Z01812 Encounter for preprocedural laboratory examination: Secondary | ICD-10-CM | POA: Diagnosis present

## 2018-12-29 LAB — SARS CORONAVIRUS 2 (TAT 6-24 HRS): SARS Coronavirus 2: NEGATIVE

## 2019-01-01 ENCOUNTER — Ambulatory Visit: Payer: Medicare HMO | Admitting: Certified Registered"

## 2019-01-01 ENCOUNTER — Other Ambulatory Visit: Payer: Self-pay

## 2019-01-01 ENCOUNTER — Ambulatory Visit
Admission: RE | Admit: 2019-01-01 | Discharge: 2019-01-01 | Disposition: A | Discharge status: Home / Self Care | Payer: Medicare HMO | Attending: Ophthalmology | Admitting: Ophthalmology

## 2019-01-01 ENCOUNTER — Encounter
Admission: RE | Disposition: A | Discharge status: Home / Self Care | Payer: Self-pay | Source: Home / Self Care | Attending: Ophthalmology

## 2019-01-01 DIAGNOSIS — Z9221 Personal history of antineoplastic chemotherapy: Secondary | ICD-10-CM | POA: Diagnosis not present

## 2019-01-01 DIAGNOSIS — I2510 Atherosclerotic heart disease of native coronary artery without angina pectoris: Secondary | ICD-10-CM | POA: Insufficient documentation

## 2019-01-01 DIAGNOSIS — Z7901 Long term (current) use of anticoagulants: Secondary | ICD-10-CM | POA: Diagnosis not present

## 2019-01-01 DIAGNOSIS — E039 Hypothyroidism, unspecified: Secondary | ICD-10-CM | POA: Insufficient documentation

## 2019-01-01 DIAGNOSIS — Z951 Presence of aortocoronary bypass graft: Secondary | ICD-10-CM | POA: Diagnosis not present

## 2019-01-01 DIAGNOSIS — Z856 Personal history of leukemia: Secondary | ICD-10-CM | POA: Diagnosis not present

## 2019-01-01 DIAGNOSIS — Z79890 Hormone replacement therapy (postmenopausal): Secondary | ICD-10-CM | POA: Diagnosis not present

## 2019-01-01 DIAGNOSIS — Z79899 Other long term (current) drug therapy: Secondary | ICD-10-CM | POA: Insufficient documentation

## 2019-01-01 DIAGNOSIS — G4730 Sleep apnea, unspecified: Secondary | ICD-10-CM | POA: Insufficient documentation

## 2019-01-01 DIAGNOSIS — I4891 Unspecified atrial fibrillation: Secondary | ICD-10-CM | POA: Diagnosis not present

## 2019-01-01 DIAGNOSIS — K219 Gastro-esophageal reflux disease without esophagitis: Secondary | ICD-10-CM | POA: Diagnosis not present

## 2019-01-01 DIAGNOSIS — Z888 Allergy status to other drugs, medicaments and biological substances status: Secondary | ICD-10-CM | POA: Insufficient documentation

## 2019-01-01 DIAGNOSIS — E7800 Pure hypercholesterolemia, unspecified: Secondary | ICD-10-CM | POA: Diagnosis not present

## 2019-01-01 DIAGNOSIS — Z880 Allergy status to penicillin: Secondary | ICD-10-CM | POA: Insufficient documentation

## 2019-01-01 DIAGNOSIS — Z885 Allergy status to narcotic agent status: Secondary | ICD-10-CM | POA: Insufficient documentation

## 2019-01-01 DIAGNOSIS — I110 Hypertensive heart disease with heart failure: Secondary | ICD-10-CM | POA: Insufficient documentation

## 2019-01-01 DIAGNOSIS — E1140 Type 2 diabetes mellitus with diabetic neuropathy, unspecified: Secondary | ICD-10-CM | POA: Insufficient documentation

## 2019-01-01 DIAGNOSIS — Z794 Long term (current) use of insulin: Secondary | ICD-10-CM | POA: Insufficient documentation

## 2019-01-01 DIAGNOSIS — Z955 Presence of coronary angioplasty implant and graft: Secondary | ICD-10-CM | POA: Insufficient documentation

## 2019-01-01 DIAGNOSIS — I252 Old myocardial infarction: Secondary | ICD-10-CM | POA: Diagnosis not present

## 2019-01-01 DIAGNOSIS — H2511 Age-related nuclear cataract, right eye: Secondary | ICD-10-CM | POA: Insufficient documentation

## 2019-01-01 DIAGNOSIS — Z882 Allergy status to sulfonamides status: Secondary | ICD-10-CM | POA: Insufficient documentation

## 2019-01-01 DIAGNOSIS — E1136 Type 2 diabetes mellitus with diabetic cataract: Secondary | ICD-10-CM | POA: Insufficient documentation

## 2019-01-01 HISTORY — PX: CATARACT EXTRACTION W/PHACO: SHX586

## 2019-01-01 HISTORY — DX: Cardiac arrhythmia, unspecified: I49.9

## 2019-01-01 LAB — GLUCOSE, CAPILLARY: Glucose-Capillary: 249 mg/dL — ABNORMAL HIGH (ref 70–99)

## 2019-01-01 SURGERY — PHACOEMULSIFICATION, CATARACT, WITH IOL INSERTION
Anesthesia: Monitor Anesthesia Care | Site: Eye | Laterality: Right

## 2019-01-01 MED ORDER — POVIDONE-IODINE 5 % OP SOLN
OPHTHALMIC | Status: DC | PRN
  Administered 2019-01-01: 1 via OPHTHALMIC

## 2019-01-01 MED ORDER — MOXIFLOXACIN HCL 0.5 % OP SOLN
OPHTHALMIC | Status: AC
  Filled 2019-01-01: qty 3

## 2019-01-01 MED ORDER — EPINEPHRINE PF 1 MG/ML IJ SOLN
INTRAOCULAR | Status: DC | PRN
  Administered 2019-01-01: 12:00:00 via OPHTHALMIC

## 2019-01-01 MED ORDER — MOXIFLOXACIN HCL 0.5 % OP SOLN
OPHTHALMIC | Status: DC | PRN
  Administered 2019-01-01: 0.2 mL via OPHTHALMIC

## 2019-01-01 MED ORDER — SODIUM CHLORIDE 0.9 % IV SOLN: INTRAVENOUS | Status: DC

## 2019-01-01 MED ORDER — TETRACAINE HCL 0.5 % OP SOLN
OPHTHALMIC | Status: AC
  Filled 2019-01-01: qty 4

## 2019-01-01 MED ORDER — ARMC OPHTHALMIC DILATING DROPS
OPHTHALMIC | Status: AC
  Administered 2019-01-01: 1 via OPHTHALMIC
  Filled 2019-01-01: qty 0.5

## 2019-01-01 MED ORDER — NA CHONDROIT SULF-NA HYALURON 40-17 MG/ML IO SOLN
INTRAOCULAR | Status: DC | PRN
  Administered 2019-01-01: 1 mL via INTRAOCULAR

## 2019-01-01 MED ORDER — ARMC OPHTHALMIC DILATING DROPS
1.0000 "application " | OPHTHALMIC | Status: AC
  Administered 2019-01-01 (×3): 1 via OPHTHALMIC

## 2019-01-01 MED ORDER — MOXIFLOXACIN HCL 0.5 % OP SOLN: 1.0000 [drp] | Freq: Once | OPHTHALMIC | Status: DC

## 2019-01-01 MED ORDER — MIDAZOLAM HCL 2 MG/2ML IJ SOLN
INTRAMUSCULAR | Status: AC
  Filled 2019-01-01: qty 2

## 2019-01-01 MED ORDER — CARBACHOL 0.01 % IO SOLN
INTRAOCULAR | Status: DC | PRN
  Administered 2019-01-01: 0.5 mL via INTRAOCULAR

## 2019-01-01 MED ORDER — FENTANYL CITRATE (PF) 100 MCG/2ML IJ SOLN
INTRAMUSCULAR | Status: DC | PRN
  Administered 2019-01-01: 50 ug via INTRAVENOUS

## 2019-01-01 MED ORDER — ONDANSETRON HCL 4 MG/2ML IJ SOLN
INTRAMUSCULAR | Status: DC | PRN
  Administered 2019-01-01: 4 mg via INTRAVENOUS

## 2019-01-01 MED ORDER — FENTANYL CITRATE (PF) 100 MCG/2ML IJ SOLN
INTRAMUSCULAR | Status: AC
  Filled 2019-01-01: qty 2

## 2019-01-01 MED ORDER — LIDOCAINE HCL (PF) 4 % IJ SOLN
INTRAOCULAR | Status: DC | PRN
  Administered 2019-01-01: 4 mL via OPHTHALMIC

## 2019-01-01 MED ORDER — TETRACAINE HCL 0.5 % OP SOLN
1.0000 [drp] | Freq: Two times a day (BID) | OPHTHALMIC | Status: AC
  Administered 2019-01-01 (×2): 1 [drp] via OPHTHALMIC

## 2019-01-01 MED ORDER — MIDAZOLAM HCL 2 MG/2ML IJ SOLN
INTRAMUSCULAR | Status: DC | PRN
  Administered 2019-01-01 (×2): 1 mg via INTRAVENOUS

## 2019-01-01 MED ORDER — ONDANSETRON HCL 4 MG/2ML IJ SOLN
INTRAMUSCULAR | Status: AC
  Filled 2019-01-01: qty 2

## 2019-01-01 SURGICAL SUPPLY — 16 items
GLOVE BIO SURGEON STRL SZ8 (GLOVE) ×2 IMPLANT
GLOVE BIOGEL M 6.5 STRL (GLOVE) ×2 IMPLANT
GLOVE SURG LX 8.0 MICRO (GLOVE) ×1
GLOVE SURG LX STRL 8.0 MICRO (GLOVE) ×1 IMPLANT
GOWN STRL REUS W/ TWL LRG LVL3 (GOWN DISPOSABLE) ×2 IMPLANT
GOWN STRL REUS W/TWL LRG LVL3 (GOWN DISPOSABLE) ×4
LABEL CATARACT MEDS ST (LABEL) ×2 IMPLANT
LENS IOL TECNIS ITEC 22.5 (Intraocular Lens) ×1 IMPLANT
PACK CATARACT (MISCELLANEOUS) ×2 IMPLANT
PACK CATARACT BRASINGTON LX (MISCELLANEOUS) ×2 IMPLANT
PACK EYE AFTER SURG (MISCELLANEOUS) ×2 IMPLANT
SOL BSS BAG (MISCELLANEOUS) ×2
SOLUTION BSS BAG (MISCELLANEOUS) ×1 IMPLANT
SYR 5ML LL (SYRINGE) ×2 IMPLANT
WATER STERILE IRR 250ML POUR (IV SOLUTION) ×2 IMPLANT
WIPE NON LINTING 3.25X3.25 (MISCELLANEOUS) ×2 IMPLANT

## 2019-01-01 NOTE — H&P (Signed)
All labs reviewed. Abnormal studies sent to patients PCP when indicated.  Previous H&P reviewed, patient examined, there are NO CHANGES.  Jimmy Borkowski Porfilio11/20/202011:47 AM

## 2019-01-01 NOTE — Anesthesia Post-op Follow-up Note (Signed)
Anesthesia QCDR form completed.        

## 2019-01-01 NOTE — Transfer of Care (Signed)
Immediate Anesthesia Transfer of Care Note  Patient: Jimmy Barry  Procedure(s) Performed: CATARACT EXTRACTION PHACO AND INTRAOCULAR LENS PLACEMENT (IOC)RIGHT DIABETIC (Right Eye)  Patient Location: PACU  Anesthesia Type:MAC  Level of Consciousness: awake, alert  and oriented  Airway & Oxygen Therapy: Patient Spontanous Breathing  Post-op Assessment: Report given to RN and Post -op Vital signs reviewed and stable  Post vital signs: Reviewed and stable  Last Vitals:  Vitals Value Taken Time  BP 114/59 01/01/19 1146  Temp 36.6 C 01/01/19 1146  Pulse 78 01/01/19 1146  Resp 14 01/01/19 1146  SpO2 96 % 01/01/19 1146    Last Pain:  Vitals:   01/01/19 1146  TempSrc: Temporal  PainSc: 0-No pain         Complications: No apparent anesthesia complications

## 2019-01-01 NOTE — Anesthesia Preprocedure Evaluation (Signed)
Anesthesia Evaluation  Patient identified by MRN, date of birth, ID band Patient awake    Reviewed: Allergy & Precautions, H&P , NPO status , Patient's Chart, lab work & pertinent test results, reviewed documented beta blocker date and time   History of Anesthesia Complications Negative for: history of anesthetic complications  Airway Mallampati: I  TM Distance: >3 FB Neck ROM: full    Dental  (+) Upper Dentures, Lower Dentures, Dental Advidsory Given   Pulmonary neg shortness of breath, sleep apnea , neg COPD, neg recent URI,    Pulmonary exam normal        Cardiovascular Exercise Tolerance: Good hypertension, (-) angina+ CAD, + Past MI, + Cardiac Stents, + CABG and +CHF  Normal cardiovascular exam+ dysrhythmias (s/p ablation) Atrial Fibrillation (-) Valvular Problems/Murmurs     Neuro/Psych negative neurological ROS  negative psych ROS   GI/Hepatic Neg liver ROS, GERD  ,  Endo/Other  diabetesHypothyroidism   Renal/GU negative Renal ROS  negative genitourinary   Musculoskeletal   Abdominal   Peds  Hematology negative hematology ROS (+)   Anesthesia Other Findings Past Medical History: 03/2018: Atrial flutter (HCC)     Comment:  Coronary Ablation  No date: CAD (coronary artery disease) 1994: Cancer ([place])     Comment:  Leukemia No date: CHF (congestive heart failure) (HCC) No date: Diabetes mellitus without complication (HCC) No date: Dysrhythmia     Comment:  atrial fib No date: GERD (gastroesophageal reflux disease) No date: Headache     Comment:  migraines 3-4x/month No date: HOH (hard of hearing)     Comment:  has aides, doesn't wear No date: Hypercholesteremia No date: Hypertension No date: Hypothyroidism 2007: Myocardial infarction ([place]) No date: Neuropathy No date: Sleep apnea     Comment:  does not wear CPAP No date: Wears dentures     Comment:  full upper and lower    Reproductive/Obstetrics negative OB ROS                             Anesthesia Physical Anesthesia Plan  ASA: III  Anesthesia Plan: MAC   Post-op Pain Management:    Induction:   PONV Risk Score and Plan:   Airway Management Planned: Natural Airway and Nasal Cannula  Additional Equipment:   Intra-op Plan:   Post-operative Plan:   Informed Consent: I have reviewed the patients History and Physical, chart, labs and discussed the procedure including the risks, benefits and alternatives for the proposed anesthesia with the patient or authorized representative who has indicated his/her understanding and acceptance.     Dental Advisory Given  Plan Discussed with: Anesthesiologist, CRNA and Surgeon  Anesthesia Plan Comments:         Anesthesia Quick Evaluation

## 2019-01-01 NOTE — Discharge Instructions (Signed)
Eye Surgery Discharge Instructions    Expect mild scratchy sensation or mild soreness. DO NOT RUB YOUR EYE!  The day of surgery:  Minimal physical activity, but bed rest is not required  No reading, computer work, or close hand work  No bending, lifting, or straining.  May watch TV  For 24 hours:  No driving, legal decisions, or alcoholic beverages  Safety precautions  Eat anything you prefer: It is better to start with liquids, then soup then solid foods.  _____ Eye patch should be worn until postoperative exam tomorrow.  ____ Solar shield eyeglasses should be worn for comfort in the sunlight/patch while sleeping  Resume all regular medications including aspirin or Coumadin if these were discontinued prior to surgery. You may shower, bathe, shave, or wash your hair. Tylenol may be taken for mild discomfort.  Call your doctor if you experience significant pain, nausea, or vomiting, fever > 101 or other signs of infection. [phone] or [phone] Specific instructions:  Follow-up Information    Birder Robson, MD Follow up on 01/01/2019.   Specialty: Ophthalmology Why: appointment time at 1:40 PM Contact information: [place] [address] [phone]         AMBULATORY SURGERY  DISCHARGE INSTRUCTIONS   1) The drugs that you were given will stay in your system until tomorrow so for the next 24 hours you should not:  A) Drive an automobile B) Make any legal decisions C) Drink any alcoholic beverage   2) You may resume regular meals tomorrow.  Today it is better to start with liquids and gradually work up to solid foods.  You may eat anything you prefer, but it is better to start with liquids, then soup and crackers, and gradually work up to solid foods.   3) Please notify your doctor immediately if you have any unusual bleeding, trouble breathing, redness and pain at the surgery site, drainage, fever, or pain not relieved by  medication.    4) Additional Instructions:        Please contact your physician with any problems or Same Day Surgery at [phone], Monday through Friday 6 am to 4 pm, or [place] at [facility] number at [phone].

## 2019-01-01 NOTE — Op Note (Signed)
PREOPERATIVE DIAGNOSIS:  Nuclear sclerotic cataract of the right eye.   POSTOPERATIVE DIAGNOSIS:  NUCLEAR SCLEROTIC CATARACT RIGHT EYE   OPERATIVE PROCEDURE: Procedure(s): CATARACT EXTRACTION PHACO AND INTRAOCULAR LENS PLACEMENT (IOC)RIGHT DIABETIC   SURGEON:  Birder Robson, MD.   ANESTHESIA:  Anesthesiologist: Martha Clan, MD CRNA: Disser, Einar Grad, CRNA; Jerrye Noble, CRNA  1.      Managed anesthesia care. 2.      0.66ml of Shugarcaine was instilled in the eye following the paracentesis.   COMPLICATIONS:  None.   TECHNIQUE:   Stop and chop   DESCRIPTION OF PROCEDURE:  The patient was examined and consented in the preoperative holding area where the aforementioned topical anesthesia was applied to the right eye and then brought back to the Operating Room where the right eye was prepped and draped in the usual sterile ophthalmic fashion and a lid speculum was placed. A paracentesis was created with the side port blade and the anterior chamber was filled with viscoelastic. A near clear corneal incision was performed with the steel keratome. A continuous curvilinear capsulorrhexis was performed with a cystotome followed by the capsulorrhexis forceps. Hydrodissection and hydrodelineation were carried out with BSS on a blunt cannula. The lens was removed in a stop and chop  technique and the remaining cortical material was removed with the irrigation-aspiration handpiece. The capsular bag was inflated with viscoelastic and the Technis ZCB00  lens was placed in the capsular bag without complication. The remaining viscoelastic was removed from the eye with the irrigation-aspiration handpiece. The wounds were hydrated. The anterior chamber was flushed with Miostat and the eye was inflated to physiologic pressure. 0.46ml of Vigamox was placed in the anterior chamber. The wounds were found to be water tight. The eye was dressed with Vigamox. The patient was given protective glasses to wear  throughout the day and a shield with which to sleep tonight. The patient was also given drops with which to begin a drop regimen today and will follow-up with me in one day. Implant Name Type Inv. Item Serial No. Manufacturer Lot No. LRB No. Used Action  LENS IOL DIOP 22.5 - [id number] Intraocular Lens LENS IOL DIOP 22.5 [id number]  Right 1 Implanted   Procedure(s) with comments: CATARACT EXTRACTION PHACO AND INTRAOCULAR LENS PLACEMENT (IOC)RIGHT DIABETIC (Right) - [place] 01:02.5 CDE 7.36 Fluid Pack Lot # [id number]  Electronically signed: Birder Robson 01/01/2019 11:45 AM

## 2019-01-01 NOTE — Anesthesia Postprocedure Evaluation (Signed)
Anesthesia Post Note  Patient: Jimmy Barry  Procedure(s) Performed: CATARACT EXTRACTION PHACO AND INTRAOCULAR LENS PLACEMENT (IOC)RIGHT DIABETIC (Right Eye)  Patient location during evaluation: PACU Anesthesia Type: MAC Level of consciousness: awake and alert Pain management: pain level controlled Vital Signs Assessment: post-procedure vital signs reviewed and stable Respiratory status: spontaneous breathing, nonlabored ventilation, respiratory function stable and patient connected to nasal cannula oxygen Cardiovascular status: stable and blood pressure returned to baseline Postop Assessment: no apparent nausea or vomiting Anesthetic complications: no     Last Vitals:  Vitals:   01/01/19 0949 01/01/19 1146  BP: 114/65 (!) 114/59  Pulse: 85 78  Resp: 17 14  Temp: 36.7 C 36.6 C  SpO2: 99% 96%    Last Pain:  Vitals:   01/01/19 1146  TempSrc: Temporal  PainSc: 0-No pain                 Martha Clan

## 2019-01-14 ENCOUNTER — Encounter: Payer: Self-pay | Admitting: *Deleted

## 2019-02-09 ENCOUNTER — Other Ambulatory Visit
Admission: RE | Admit: 2019-02-09 | Discharge: 2019-02-09 | Disposition: A | Discharge status: Home / Self Care | Payer: Medicare HMO | Source: Ambulatory Visit | Attending: Ophthalmology | Admitting: Ophthalmology

## 2019-02-09 ENCOUNTER — Other Ambulatory Visit: Payer: Self-pay

## 2019-02-09 DIAGNOSIS — Z20828 Contact with and (suspected) exposure to other viral communicable diseases: Secondary | ICD-10-CM | POA: Diagnosis not present

## 2019-02-09 DIAGNOSIS — Z01812 Encounter for preprocedural laboratory examination: Secondary | ICD-10-CM | POA: Insufficient documentation

## 2019-02-09 LAB — SARS CORONAVIRUS 2 (TAT 6-24 HRS): SARS Coronavirus 2: NEGATIVE

## 2019-02-11 ENCOUNTER — Other Ambulatory Visit: Payer: Self-pay

## 2019-02-11 ENCOUNTER — Encounter: Payer: Self-pay | Admitting: Ophthalmology

## 2019-02-11 ENCOUNTER — Encounter
Admission: RE | Disposition: A | Discharge status: Home / Self Care | Payer: Self-pay | Source: Home / Self Care | Attending: Ophthalmology

## 2019-02-11 ENCOUNTER — Ambulatory Visit: Payer: Medicare HMO | Admitting: Registered Nurse

## 2019-02-11 ENCOUNTER — Ambulatory Visit
Admission: RE | Admit: 2019-02-11 | Discharge: 2019-02-11 | Disposition: A | Discharge status: Home / Self Care | Payer: Medicare HMO | Attending: Ophthalmology | Admitting: Ophthalmology

## 2019-02-11 DIAGNOSIS — I4891 Unspecified atrial fibrillation: Secondary | ICD-10-CM | POA: Insufficient documentation

## 2019-02-11 DIAGNOSIS — I110 Hypertensive heart disease with heart failure: Secondary | ICD-10-CM | POA: Insufficient documentation

## 2019-02-11 DIAGNOSIS — G4730 Sleep apnea, unspecified: Secondary | ICD-10-CM | POA: Insufficient documentation

## 2019-02-11 DIAGNOSIS — E7800 Pure hypercholesterolemia, unspecified: Secondary | ICD-10-CM | POA: Diagnosis not present

## 2019-02-11 DIAGNOSIS — Z7982 Long term (current) use of aspirin: Secondary | ICD-10-CM | POA: Diagnosis not present

## 2019-02-11 DIAGNOSIS — Z9221 Personal history of antineoplastic chemotherapy: Secondary | ICD-10-CM | POA: Diagnosis not present

## 2019-02-11 DIAGNOSIS — Z79899 Other long term (current) drug therapy: Secondary | ICD-10-CM | POA: Diagnosis not present

## 2019-02-11 DIAGNOSIS — I509 Heart failure, unspecified: Secondary | ICD-10-CM | POA: Diagnosis not present

## 2019-02-11 DIAGNOSIS — E1140 Type 2 diabetes mellitus with diabetic neuropathy, unspecified: Secondary | ICD-10-CM | POA: Insufficient documentation

## 2019-02-11 DIAGNOSIS — I2510 Atherosclerotic heart disease of native coronary artery without angina pectoris: Secondary | ICD-10-CM | POA: Insufficient documentation

## 2019-02-11 DIAGNOSIS — Z79890 Hormone replacement therapy (postmenopausal): Secondary | ICD-10-CM | POA: Diagnosis not present

## 2019-02-11 DIAGNOSIS — Z7901 Long term (current) use of anticoagulants: Secondary | ICD-10-CM | POA: Insufficient documentation

## 2019-02-11 DIAGNOSIS — Z951 Presence of aortocoronary bypass graft: Secondary | ICD-10-CM | POA: Insufficient documentation

## 2019-02-11 DIAGNOSIS — E1136 Type 2 diabetes mellitus with diabetic cataract: Secondary | ICD-10-CM | POA: Insufficient documentation

## 2019-02-11 DIAGNOSIS — Z9049 Acquired absence of other specified parts of digestive tract: Secondary | ICD-10-CM | POA: Insufficient documentation

## 2019-02-11 DIAGNOSIS — H2512 Age-related nuclear cataract, left eye: Secondary | ICD-10-CM | POA: Diagnosis not present

## 2019-02-11 DIAGNOSIS — I252 Old myocardial infarction: Secondary | ICD-10-CM | POA: Insufficient documentation

## 2019-02-11 DIAGNOSIS — Z856 Personal history of leukemia: Secondary | ICD-10-CM | POA: Diagnosis not present

## 2019-02-11 DIAGNOSIS — K219 Gastro-esophageal reflux disease without esophagitis: Secondary | ICD-10-CM | POA: Insufficient documentation

## 2019-02-11 DIAGNOSIS — Z7984 Long term (current) use of oral hypoglycemic drugs: Secondary | ICD-10-CM | POA: Diagnosis not present

## 2019-02-11 DIAGNOSIS — Z955 Presence of coronary angioplasty implant and graft: Secondary | ICD-10-CM | POA: Insufficient documentation

## 2019-02-11 DIAGNOSIS — E039 Hypothyroidism, unspecified: Secondary | ICD-10-CM | POA: Insufficient documentation

## 2019-02-11 HISTORY — PX: CATARACT EXTRACTION W/PHACO: SHX586

## 2019-02-11 LAB — GLUCOSE, CAPILLARY: Glucose-Capillary: 218 mg/dL — ABNORMAL HIGH (ref 70–99)

## 2019-02-11 SURGERY — PHACOEMULSIFICATION, CATARACT, WITH IOL INSERTION
Anesthesia: Monitor Anesthesia Care | Site: Eye | Laterality: Left

## 2019-02-11 MED ORDER — POVIDONE-IODINE 5 % OP SOLN
OPHTHALMIC | Status: DC | PRN
  Administered 2019-02-11: 1 via OPHTHALMIC

## 2019-02-11 MED ORDER — CARBACHOL 0.01 % IO SOLN
INTRAOCULAR | Status: DC | PRN
  Administered 2019-02-11: 0.5 mL via INTRAOCULAR

## 2019-02-11 MED ORDER — TETRACAINE HCL 0.5 % OP SOLN
1.0000 [drp] | Freq: Two times a day (BID) | OPHTHALMIC | Status: AC
  Administered 2019-02-11: 1 [drp] via OPHTHALMIC

## 2019-02-11 MED ORDER — MOXIFLOXACIN HCL 0.5 % OP SOLN: 1.0000 [drp] | Freq: Once | OPHTHALMIC | Status: DC

## 2019-02-11 MED ORDER — ARMC OPHTHALMIC DILATING DROPS
OPHTHALMIC | Status: AC
  Filled 2019-02-11: qty 0.5

## 2019-02-11 MED ORDER — ARMC OPHTHALMIC DILATING DROPS
1.0000 "application " | OPHTHALMIC | Status: AC
  Administered 2019-02-11 (×3): 1 via OPHTHALMIC

## 2019-02-11 MED ORDER — NA CHONDROIT SULF-NA HYALURON 40-17 MG/ML IO SOLN
INTRAOCULAR | Status: DC | PRN
  Administered 2019-02-11: 1 mL via INTRAOCULAR

## 2019-02-11 MED ORDER — POVIDONE-IODINE 5 % OP SOLN
OPHTHALMIC | Status: AC
  Filled 2019-02-11: qty 30

## 2019-02-11 MED ORDER — EPINEPHRINE PF 1 MG/ML IJ SOLN: INTRAOCULAR | Status: DC | PRN

## 2019-02-11 MED ORDER — ONDANSETRON HCL 4 MG/2ML IJ SOLN
INTRAMUSCULAR | Status: DC | PRN
  Administered 2019-02-11: 4 mg via INTRAVENOUS

## 2019-02-11 MED ORDER — MOXIFLOXACIN HCL 0.5 % OP SOLN
OPHTHALMIC | Status: DC | PRN
  Administered 2019-02-11: 0.2 mL via OPHTHALMIC

## 2019-02-11 MED ORDER — TETRACAINE HCL 0.5 % OP SOLN
OPHTHALMIC | Status: AC
  Administered 2019-02-11: 1 [drp] via OPHTHALMIC
  Filled 2019-02-11: qty 4

## 2019-02-11 MED ORDER — MIDAZOLAM HCL 2 MG/2ML IJ SOLN
INTRAMUSCULAR | Status: DC | PRN
  Administered 2019-02-11: 2 mg via INTRAVENOUS

## 2019-02-11 MED ORDER — SODIUM CHLORIDE 0.9 % IV SOLN: INTRAVENOUS | Status: DC

## 2019-02-11 MED ORDER — MIDAZOLAM HCL 2 MG/2ML IJ SOLN
INTRAMUSCULAR | Status: AC
  Filled 2019-02-11: qty 2

## 2019-02-11 MED ORDER — ONDANSETRON HCL 4 MG/2ML IJ SOLN
INTRAMUSCULAR | Status: AC
  Filled 2019-02-11: qty 2

## 2019-02-11 MED ORDER — MOXIFLOXACIN HCL 0.5 % OP SOLN
OPHTHALMIC | Status: AC
  Filled 2019-02-11: qty 3

## 2019-02-11 MED ORDER — LIDOCAINE HCL (PF) 4 % IJ SOLN
INTRAOCULAR | Status: DC | PRN
  Administered 2019-02-11: 4 mL via OPHTHALMIC

## 2019-02-11 MED ORDER — FENTANYL CITRATE (PF) 100 MCG/2ML IJ SOLN
INTRAMUSCULAR | Status: DC | PRN
  Administered 2019-02-11: 50 ug via INTRAVENOUS

## 2019-02-11 MED ORDER — FENTANYL CITRATE (PF) 100 MCG/2ML IJ SOLN
INTRAMUSCULAR | Status: AC
  Filled 2019-02-11: qty 2

## 2019-02-11 SURGICAL SUPPLY — 16 items
GLOVE BIO SURGEON STRL SZ8 (GLOVE) ×2 IMPLANT
GLOVE BIOGEL M 6.5 STRL (GLOVE) ×2 IMPLANT
GLOVE SURG LX 8.0 MICRO (GLOVE) ×1
GLOVE SURG LX STRL 8.0 MICRO (GLOVE) ×1 IMPLANT
GOWN STRL REUS W/ TWL LRG LVL3 (GOWN DISPOSABLE) ×2 IMPLANT
GOWN STRL REUS W/TWL LRG LVL3 (GOWN DISPOSABLE) ×4
LABEL CATARACT MEDS ST (LABEL) ×2 IMPLANT
LENS IOL TECNIS ITEC 22.5 (Intraocular Lens) ×1 IMPLANT
PACK CATARACT (MISCELLANEOUS) ×2 IMPLANT
PACK CATARACT BRASINGTON LX (MISCELLANEOUS) ×2 IMPLANT
PACK EYE AFTER SURG (MISCELLANEOUS) ×2 IMPLANT
SOL BSS BAG (MISCELLANEOUS) ×2
SOLUTION BSS BAG (MISCELLANEOUS) ×1 IMPLANT
SYR 5ML LL (SYRINGE) ×2 IMPLANT
WATER STERILE IRR 250ML POUR (IV SOLUTION) ×2 IMPLANT
WIPE NON LINTING 3.25X3.25 (MISCELLANEOUS) ×2 IMPLANT

## 2019-02-11 NOTE — Anesthesia Post-op Follow-up Note (Signed)
Anesthesia QCDR form completed.        

## 2019-02-11 NOTE — Transfer of Care (Signed)
Immediate Anesthesia Transfer of Care Note  Patient: Jimmy Barry  Procedure(s) Performed: CATARACT EXTRACTION PHACO AND INTRAOCULAR LENS PLACEMENT (IOC) LEFT DIABETIC (Left Eye)  Patient Location: PACU  Anesthesia Type:MAC  Level of Consciousness: awake, drowsy and patient cooperative  Airway & Oxygen Therapy: Patient Spontanous Breathing  Post-op Assessment: Report given to RN and Post -op Vital signs reviewed and stable  Post vital signs: Reviewed and stable  Last Vitals:  Vitals Value Taken Time  BP 132/71 02/11/19 0854  Temp    Pulse 76 02/11/19 0854  Resp 14 02/11/19 0854  SpO2 97 % 02/11/19 0854    Last Pain:  Vitals:   02/11/19 0854  TempSrc:   PainSc: 0-No pain         Complications: No apparent anesthesia complications

## 2019-02-11 NOTE — Anesthesia Postprocedure Evaluation (Signed)
Anesthesia Post Note  Patient: Jimmy Barry  Procedure(s) Performed: CATARACT EXTRACTION PHACO AND INTRAOCULAR LENS PLACEMENT (IOC) LEFT DIABETIC (Left Eye)  Patient location during evaluation: Phase II Anesthesia Type: MAC Level of consciousness: awake and alert Pain management: pain level controlled Vital Signs Assessment: post-procedure vital signs reviewed and stable Respiratory status: spontaneous breathing, nonlabored ventilation and respiratory function stable Cardiovascular status: blood pressure returned to baseline and stable Postop Assessment: no apparent nausea or vomiting Anesthetic complications: no     Last Vitals:  Vitals:   02/11/19 0742 02/11/19 0854  BP: 115/75 132/71  Pulse: 75 76  Resp: 16 14  Temp: 36.6 C   SpO2: 99% 97%    Last Pain:  Vitals:   02/11/19 0854  TempSrc:   PainSc: 0-No pain                 Alphonsus Sias

## 2019-02-11 NOTE — Discharge Instructions (Signed)

## 2019-02-11 NOTE — Op Note (Signed)
PREOPERATIVE DIAGNOSIS:  Nuclear sclerotic cataract of the left eye.   POSTOPERATIVE DIAGNOSIS:  Nuclear sclerotic cataract of the left eye.   OPERATIVE PROCEDURE: Procedure(s): CATARACT EXTRACTION PHACO AND INTRAOCULAR LENS PLACEMENT (IOC) LEFT DIABETIC   SURGEON:  Birder Robson, MD.   ANESTHESIA:  Anesthesiologist: Alphonsus Sias, MD CRNA: Lia Foyer, CRNA; Rolla Plate, CRNA  1.      Managed anesthesia care. 2.     0.29ml of Shugarcaine was instilled following the paracentesis   COMPLICATIONS:  None.   TECHNIQUE:   Stop and chop   DESCRIPTION OF PROCEDURE:  The patient was examined and consented in the preoperative holding area where the aforementioned topical anesthesia was applied to the left eye and then brought back to the Operating Room where the left eye was prepped and draped in the usual sterile ophthalmic fashion and a lid speculum was placed. A paracentesis was created with the side port blade and the anterior chamber was filled with viscoelastic. A near clear corneal incision was performed with the steel keratome. A continuous curvilinear capsulorrhexis was performed with a cystotome followed by the capsulorrhexis forceps. Hydrodissection and hydrodelineation were carried out with BSS on a blunt cannula. The lens was removed in a stop and chop  technique and the remaining cortical material was removed with the irrigation-aspiration handpiece. The capsular bag was inflated with viscoelastic and the Technis ZCB00 lens was placed in the capsular bag without complication. The remaining viscoelastic was removed from the eye with the irrigation-aspiration handpiece. The wounds were hydrated. The anterior chamber was flushed with Miostat and the eye was inflated to physiologic pressure. 0.26ml Vigamox was placed in the anterior chamber. The wounds were found to be water tight. The eye was dressed with Vigamox. The patient was given protective glasses to wear throughout the day  and a shield with which to sleep tonight. The patient was also given drops with which to begin a drop regimen today and will follow-up with me in one day. Implant Name Type Inv. Item Serial No. Manufacturer Lot No. LRB No. Used Action  LENS IOL DIOP 22.5 - [id number] Intraocular Lens LENS IOL DIOP 22.5 [id number] 2001 [place]  Left 1 Implanted    Procedure(s) with comments: CATARACT EXTRACTION PHACO AND INTRAOCULAR LENS PLACEMENT (IOC) LEFT DIABETIC (Left) - [place] 00:41.0 CDE 5.61 Fluid Pack lot # [id number]  Electronically signed: Birder Robson 02/11/2019 8:53 AM

## 2019-02-11 NOTE — H&P (Signed)
All labs reviewed. Abnormal studies sent to patients PCP when indicated.  Previous H&P reviewed, patient examined, there are NO CHANGES.  Leetta Hendriks Porfilio12/31/20208:14 AM

## 2019-02-11 NOTE — Anesthesia Preprocedure Evaluation (Signed)
Anesthesia Evaluation  Patient identified by MRN, date of birth, ID band Patient awake    Reviewed: Allergy & Precautions, H&P , NPO status , reviewed documented beta blocker date and time   Airway Mallampati: I  TM Distance: >3 FB Neck ROM: full    Dental  (+) Upper Dentures, Lower Dentures   Pulmonary sleep apnea ,    Pulmonary exam normal        Cardiovascular hypertension, + CAD, + Past MI and +CHF  Normal cardiovascular exam+ dysrhythmias      Neuro/Psych  Headaches,    GI/Hepatic GERD  ,  Endo/Other  diabetesHypothyroidism   Renal/GU      Musculoskeletal   Abdominal   Peds  Hematology   Anesthesia Other Findings Past Medical History: 03/2018: Atrial flutter (HCC)     Comment:  Coronary Ablation  No date: CAD (coronary artery disease) 1994: Cancer ([place])     Comment:  Leukemia No date: CHF (congestive heart failure) (HCC) No date: Diabetes mellitus without complication (HCC) No date: Dysrhythmia     Comment:  atrial fib No date: GERD (gastroesophageal reflux disease) No date: Headache     Comment:  migraines 3-4x/month No date: HOH (hard of hearing)     Comment:  has aides, doesn't wear No date: Hypercholesteremia No date: Hypertension No date: Hypothyroidism 2007: Myocardial infarction ([place]) No date: Neuropathy No date: Sleep apnea     Comment:  does not wear CPAP No date: Wears dentures     Comment:  full upper and lower  Past Surgical History: No date: APPENDECTOMY 2016: CARDIAC CATHETERIZATION     Comment:  stents 01/01/2019: CATARACT EXTRACTION W/PHACO; Right     Comment:  Procedure: CATARACT EXTRACTION PHACO AND INTRAOCULAR               LENS PLACEMENT (IOC)RIGHT DIABETIC;  Surgeon: Birder Robson, MD;  Location: [facility] ORS;  Service:               Ophthalmology;  Laterality: Right;  [place] 01:02.5 CDE               7.36 Fluid Pack Lot # [id number] No date:  CHOLECYSTECTOMY 1994: COLON RESECTION     Comment:  perforated bowel after first chemo No date: COLON SURGERY     Comment:  colon resection with colostomy and later reversal 2017: CORONARY ARTERY BYPASS GRAFT No date: KNEE ARTHROSCOPY No date: TONSILLECTOMY  BMI    Body Mass Index: 24.39 kg/m      Reproductive/Obstetrics                             Anesthesia Physical Anesthesia Plan  ASA: III  Anesthesia Plan: MAC   Post-op Pain Management:    Induction: Intravenous  PONV Risk Score and Plan: Treatment may vary due to age or medical condition and TIVA  Airway Management Planned: Nasal Cannula and Natural Airway  Additional Equipment:   Intra-op Plan:   Post-operative Plan:   Informed Consent: I have reviewed the patients History and Physical, chart, labs and discussed the procedure including the risks, benefits and alternatives for the proposed anesthesia with the patient or authorized representative who has indicated his/her understanding and acceptance.     Dental Advisory Given  Plan Discussed with: CRNA  Anesthesia Plan Comments:         Anesthesia Quick Evaluation

## 2020-04-06 ENCOUNTER — Other Ambulatory Visit (HOSPITAL_COMMUNITY): Payer: Self-pay | Admitting: Orthopedic Surgery

## 2020-04-06 ENCOUNTER — Other Ambulatory Visit: Payer: Self-pay

## 2020-04-06 ENCOUNTER — Encounter (HOSPITAL_BASED_OUTPATIENT_CLINIC_OR_DEPARTMENT_OTHER): Payer: Self-pay | Admitting: Orthopedic Surgery

## 2020-04-07 NOTE — Progress Notes (Signed)
Chart reviewed with Dr Roanna Banning for bunion surgery on 04-13-20. Patient reports off and on chest pain at rest for past 2 weeks which he has been taking nitroglycerine with ease of chest pain. He is also on long last nitrate Imdur. Dr Roanna Banning states that patient will need a followup with his cardiologist at the [facility] before surgery. Jimmy Barry at [facility] office notified.

## 2020-04-10 ENCOUNTER — Encounter (HOSPITAL_COMMUNITY): Payer: Self-pay | Admitting: Certified Registered"

## 2020-04-11 ENCOUNTER — Inpatient Hospital Stay: Admission: RE | Admit: 2020-04-11 | Payer: No Typology Code available for payment source | Source: Ambulatory Visit

## 2020-04-13 ENCOUNTER — Ambulatory Visit (HOSPITAL_COMMUNITY)
Admission: RE | Admit: 2020-04-13 | Payer: No Typology Code available for payment source | Source: Home / Self Care | Admitting: Orthopedic Surgery

## 2020-04-13 HISTORY — DX: Other forms of angina pectoris: I20.8

## 2020-04-13 HISTORY — DX: Other forms of angina pectoris: I20.89

## 2020-04-13 SURGERY — BUNIONECTOMY WITH WEIL OSTEOTOMY
Anesthesia: General | Laterality: Right

## 2020-04-28 ENCOUNTER — Other Ambulatory Visit (HOSPITAL_COMMUNITY): Payer: Self-pay | Admitting: Orthopedic Surgery

## 2020-04-28 ENCOUNTER — Encounter (HOSPITAL_BASED_OUTPATIENT_CLINIC_OR_DEPARTMENT_OTHER): Payer: Self-pay | Admitting: Orthopedic Surgery

## 2020-04-28 ENCOUNTER — Other Ambulatory Visit: Payer: Self-pay

## 2020-05-01 ENCOUNTER — Other Ambulatory Visit (HOSPITAL_COMMUNITY): Payer: No Typology Code available for payment source

## 2020-05-02 ENCOUNTER — Other Ambulatory Visit (HOSPITAL_COMMUNITY)
Admission: RE | Admit: 2020-05-02 | Discharge: 2020-05-02 | Disposition: A | Discharge status: Home / Self Care | Payer: No Typology Code available for payment source | Source: Ambulatory Visit | Attending: Orthopedic Surgery | Admitting: Orthopedic Surgery

## 2020-05-02 ENCOUNTER — Encounter (HOSPITAL_BASED_OUTPATIENT_CLINIC_OR_DEPARTMENT_OTHER)
Admission: RE | Admit: 2020-05-02 | Discharge: 2020-05-02 | Disposition: A | Discharge status: Home / Self Care | Payer: No Typology Code available for payment source | Source: Ambulatory Visit | Attending: Orthopedic Surgery | Admitting: Orthopedic Surgery

## 2020-05-02 DIAGNOSIS — Z01818 Encounter for other preprocedural examination: Secondary | ICD-10-CM | POA: Insufficient documentation

## 2020-05-02 DIAGNOSIS — Z01812 Encounter for preprocedural laboratory examination: Secondary | ICD-10-CM | POA: Insufficient documentation

## 2020-05-02 DIAGNOSIS — Z20822 Contact with and (suspected) exposure to covid-19: Secondary | ICD-10-CM | POA: Diagnosis not present

## 2020-05-02 LAB — BASIC METABOLIC PANEL
Anion gap: 10 (ref 5–15)
BUN: 20 mg/dL (ref 8–23)
CO2: 21 mmol/L — ABNORMAL LOW (ref 22–32)
Calcium: 8.8 mg/dL — ABNORMAL LOW (ref 8.9–10.3)
Chloride: 101 mmol/L (ref 98–111)
Creatinine, Ser: 1 mg/dL (ref 0.61–1.24)
GFR, Estimated: >60 mL/min (ref >60)
Glucose, Bld: 164 mg/dL — ABNORMAL HIGH (ref 70–99)
Potassium: 4.5 mmol/L (ref 3.5–5.1)
Sodium: 132 mmol/L — ABNORMAL LOW (ref 135–145)

## 2020-05-02 LAB — SARS CORONAVIRUS 2 (TAT 6-24 HRS): SARS Coronavirus 2: NEGATIVE

## 2020-05-02 NOTE — Progress Notes (Addendum)

## 2020-05-03 ENCOUNTER — Encounter (HOSPITAL_BASED_OUTPATIENT_CLINIC_OR_DEPARTMENT_OTHER): Payer: Self-pay | Admitting: Orthopedic Surgery

## 2020-05-03 NOTE — Anesthesia Preprocedure Evaluation (Addendum)
Anesthesia Evaluation  Patient identified by MRN, date of birth, ID band Patient awake    Reviewed: Allergy & Precautions, NPO status , Patient's Chart, lab work & pertinent test results, reviewed documented beta blocker date and time   Airway Mallampati: I  TM Distance: >3 FB Neck ROM: Full    Dental  (+) Upper Dentures, Lower Dentures   Pulmonary sleep apnea ,    Pulmonary exam normal breath sounds clear to auscultation       Cardiovascular hypertension, Pt. on medications and Pt. on home beta blockers + angina with exertion + CAD, + Past MI, + Cardiac Stents, + CABG and +CHF  Normal cardiovascular exam+ dysrhythmias Atrial Fibrillation  Rhythm:Regular Rate:Normal  MI 2007 Atrial fibrillation ablation  [facility] 05/02/20 NSR, LAD  CABG x 3 03/2016 Stent x 2 2018  Cardiac clearance noted   Neuro/Psych  Headaches, Depression Peripheral neuropathy HOH bilaterally    GI/Hepatic Neg liver ROS, GERD  Medicated and Controlled,Hx/o esophageal stricture S/P dilation   Endo/Other  diabetes, Poorly Controlled, Type 2, Oral Hypoglycemic AgentsHypothyroidism Hyperlipidemia Low testosterone  Renal/GU negative Renal ROS  negative genitourinary   Musculoskeletal Bunion right foot Hammertoe right 2nd   Abdominal   Peds  Hematology Pradaxa therapy- last dose 04/28/20 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   Anesthesia Other Findings   Reproductive/Obstetrics                            Anesthesia Physical Anesthesia Plan  ASA: III  Anesthesia Plan: MAC and Regional   Post-op Pain Management:    Induction: Intravenous  PONV Risk Score and Plan: 1 and Propofol infusion, Treatment may vary due to age or medical condition and Ondansetron  Airway Management Planned: Natural Airway and Simple Face Mask  Additional Equipment:   Intra-op Plan:   Post-operative Plan:   Informed Consent: I have reviewed the  patients History and Physical, chart, labs and discussed the procedure including the risks, benefits and alternatives for the proposed anesthesia with the patient or authorized representative who has indicated his/her understanding and acceptance.     Dental advisory given  Plan Discussed with: CRNA, Surgeon and Anesthesiologist  Anesthesia Plan Comments:        Anesthesia Quick Evaluation

## 2020-05-04 ENCOUNTER — Other Ambulatory Visit: Payer: Self-pay

## 2020-05-04 ENCOUNTER — Encounter (HOSPITAL_BASED_OUTPATIENT_CLINIC_OR_DEPARTMENT_OTHER): Payer: Self-pay | Admitting: Orthopedic Surgery

## 2020-05-04 ENCOUNTER — Ambulatory Visit (HOSPITAL_BASED_OUTPATIENT_CLINIC_OR_DEPARTMENT_OTHER): Payer: No Typology Code available for payment source | Admitting: Anesthesiology

## 2020-05-04 ENCOUNTER — Ambulatory Visit (HOSPITAL_BASED_OUTPATIENT_CLINIC_OR_DEPARTMENT_OTHER)
Admission: RE | Admit: 2020-05-04 | Discharge: 2020-05-04 | Disposition: A | Discharge status: Home / Self Care | Payer: No Typology Code available for payment source | Attending: Orthopedic Surgery | Admitting: Orthopedic Surgery

## 2020-05-04 ENCOUNTER — Encounter (HOSPITAL_BASED_OUTPATIENT_CLINIC_OR_DEPARTMENT_OTHER)
Admission: RE | Disposition: A | Discharge status: Home / Self Care | Payer: Self-pay | Source: Home / Self Care | Attending: Orthopedic Surgery

## 2020-05-04 DIAGNOSIS — Z885 Allergy status to narcotic agent status: Secondary | ICD-10-CM | POA: Insufficient documentation

## 2020-05-04 DIAGNOSIS — M2041 Other hammer toe(s) (acquired), right foot: Secondary | ICD-10-CM | POA: Insufficient documentation

## 2020-05-04 DIAGNOSIS — Z79899 Other long term (current) drug therapy: Secondary | ICD-10-CM | POA: Insufficient documentation

## 2020-05-04 DIAGNOSIS — I252 Old myocardial infarction: Secondary | ICD-10-CM | POA: Diagnosis not present

## 2020-05-04 DIAGNOSIS — Z7901 Long term (current) use of anticoagulants: Secondary | ICD-10-CM | POA: Diagnosis not present

## 2020-05-04 DIAGNOSIS — I110 Hypertensive heart disease with heart failure: Secondary | ICD-10-CM | POA: Insufficient documentation

## 2020-05-04 DIAGNOSIS — M21611 Bunion of right foot: Secondary | ICD-10-CM | POA: Diagnosis not present

## 2020-05-04 DIAGNOSIS — Z87891 Personal history of nicotine dependence: Secondary | ICD-10-CM | POA: Diagnosis not present

## 2020-05-04 DIAGNOSIS — E1140 Type 2 diabetes mellitus with diabetic neuropathy, unspecified: Secondary | ICD-10-CM | POA: Diagnosis not present

## 2020-05-04 DIAGNOSIS — Z856 Personal history of leukemia: Secondary | ICD-10-CM | POA: Diagnosis not present

## 2020-05-04 DIAGNOSIS — Z7984 Long term (current) use of oral hypoglycemic drugs: Secondary | ICD-10-CM | POA: Insufficient documentation

## 2020-05-04 DIAGNOSIS — I509 Heart failure, unspecified: Secondary | ICD-10-CM | POA: Insufficient documentation

## 2020-05-04 DIAGNOSIS — Z951 Presence of aortocoronary bypass graft: Secondary | ICD-10-CM | POA: Diagnosis not present

## 2020-05-04 DIAGNOSIS — Z7982 Long term (current) use of aspirin: Secondary | ICD-10-CM | POA: Diagnosis not present

## 2020-05-04 DIAGNOSIS — Z882 Allergy status to sulfonamides status: Secondary | ICD-10-CM | POA: Insufficient documentation

## 2020-05-04 DIAGNOSIS — Z888 Allergy status to other drugs, medicaments and biological substances status: Secondary | ICD-10-CM | POA: Diagnosis not present

## 2020-05-04 DIAGNOSIS — M7741 Metatarsalgia, right foot: Secondary | ICD-10-CM | POA: Diagnosis not present

## 2020-05-04 DIAGNOSIS — Z880 Allergy status to penicillin: Secondary | ICD-10-CM | POA: Diagnosis not present

## 2020-05-04 HISTORY — PX: BUNIONECTOMY WITH WEIL OSTEOTOMY: SHX5604

## 2020-05-04 LAB — GLUCOSE, CAPILLARY
Glucose-Capillary: 135 mg/dL — ABNORMAL HIGH (ref 70–99)
Glucose-Capillary: 125 mg/dL — ABNORMAL HIGH (ref 70–99)

## 2020-05-04 SURGERY — BUNIONECTOMY WITH WEIL OSTEOTOMY
Anesthesia: Monitor Anesthesia Care | Site: Foot | Laterality: Right

## 2020-05-04 MED ORDER — MIDAZOLAM HCL 2 MG/2ML IJ SOLN
2.0000 mg | Freq: Once | INTRAMUSCULAR | Status: AC
  Administered 2020-05-04: 1 mg via INTRAVENOUS

## 2020-05-04 MED ORDER — SENNA 8.6 MG PO TABS: 2.0000 | ORAL_TABLET | Freq: Two times a day (BID) | ORAL | 0 refills | Status: AC

## 2020-05-04 MED ORDER — FENTANYL CITRATE (PF) 100 MCG/2ML IJ SOLN
INTRAMUSCULAR | Status: AC
  Filled 2020-05-04: qty 2

## 2020-05-04 MED ORDER — OXYCODONE HCL 5 MG/5ML PO SOLN: 5.0000 mg | Freq: Once | ORAL | Status: DC | PRN

## 2020-05-04 MED ORDER — FENTANYL CITRATE (PF) 100 MCG/2ML IJ SOLN
100.0000 ug | Freq: Once | INTRAMUSCULAR | Status: AC
  Administered 2020-05-04: 50 ug via INTRAVENOUS

## 2020-05-04 MED ORDER — ONDANSETRON HCL 4 MG/2ML IJ SOLN
INTRAMUSCULAR | Status: AC
  Filled 2020-05-04: qty 2

## 2020-05-04 MED ORDER — ONDANSETRON HCL 4 MG/2ML IJ SOLN
4.0000 mg | Freq: Once | INTRAMUSCULAR | Status: AC
  Administered 2020-05-04: 4 mg via INTRAVENOUS

## 2020-05-04 MED ORDER — VANCOMYCIN HCL 500 MG IV SOLR
INTRAVENOUS | Status: AC
  Filled 2020-05-04: qty 500

## 2020-05-04 MED ORDER — PROPOFOL 500 MG/50ML IV EMUL
INTRAVENOUS | Status: DC | PRN
  Administered 2020-05-04: 75 ug/kg/min via INTRAVENOUS

## 2020-05-04 MED ORDER — ONDANSETRON HCL 4 MG/2ML IJ SOLN: 4.0000 mg | Freq: Once | INTRAMUSCULAR | Status: DC | PRN

## 2020-05-04 MED ORDER — ONDANSETRON HCL 4 MG PO TABS: 4.0000 mg | ORAL_TABLET | Freq: Every day | ORAL | 0 refills | Status: AC | PRN

## 2020-05-04 MED ORDER — AMISULPRIDE (ANTIEMETIC) 5 MG/2ML IV SOLN: 10.0000 mg | Freq: Once | INTRAVENOUS | Status: DC | PRN

## 2020-05-04 MED ORDER — LIDOCAINE-EPINEPHRINE 1 %-1:100000 IJ SOLN
INTRAMUSCULAR | Status: AC
  Filled 2020-05-04: qty 1

## 2020-05-04 MED ORDER — CEFAZOLIN SODIUM-DEXTROSE 2-4 GM/100ML-% IV SOLN
INTRAVENOUS | Status: AC
  Filled 2020-05-04: qty 100

## 2020-05-04 MED ORDER — 0.9 % SODIUM CHLORIDE (POUR BTL) OPTIME
TOPICAL | Status: DC | PRN
  Administered 2020-05-04: 120 mL

## 2020-05-04 MED ORDER — LACTATED RINGERS IV SOLN: INTRAVENOUS | Status: DC

## 2020-05-04 MED ORDER — CEFAZOLIN SODIUM-DEXTROSE 2-4 GM/100ML-% IV SOLN
2.0000 g | INTRAVENOUS | Status: AC
  Administered 2020-05-04: 2 g via INTRAVENOUS

## 2020-05-04 MED ORDER — BUPIVACAINE-EPINEPHRINE (PF) 0.5% -1:200000 IJ SOLN
INTRAMUSCULAR | Status: DC | PRN
  Administered 2020-05-04 (×2): 20 mL via PERINEURAL

## 2020-05-04 MED ORDER — OXYCODONE HCL 5 MG PO TABS: 5.0000 mg | ORAL_TABLET | Freq: Once | ORAL | Status: DC | PRN

## 2020-05-04 MED ORDER — SODIUM CHLORIDE 0.9 % IV SOLN: INTRAVENOUS | Status: DC

## 2020-05-04 MED ORDER — VANCOMYCIN HCL 500 MG IV SOLR
INTRAVENOUS | Status: DC | PRN
  Administered 2020-05-04: 500 mg via TOPICAL

## 2020-05-04 MED ORDER — DOCUSATE SODIUM 100 MG PO CAPS: 100.0000 mg | ORAL_CAPSULE | Freq: Two times a day (BID) | ORAL | 0 refills | Status: AC

## 2020-05-04 MED ORDER — OXYCODONE HCL 5 MG PO TABS: 5.0000 mg | ORAL_TABLET | Freq: Four times a day (QID) | ORAL | 0 refills | Status: AC | PRN

## 2020-05-04 MED ORDER — MIDAZOLAM HCL 2 MG/2ML IJ SOLN
INTRAMUSCULAR | Status: AC
  Filled 2020-05-04: qty 2

## 2020-05-04 MED ORDER — FENTANYL CITRATE (PF) 100 MCG/2ML IJ SOLN: 25.0000 ug | INTRAMUSCULAR | Status: DC | PRN

## 2020-05-04 MED ORDER — BUPIVACAINE HCL (PF) 0.5 % IJ SOLN
INTRAMUSCULAR | Status: AC
  Filled 2020-05-04: qty 30

## 2020-05-04 MED ORDER — LIDOCAINE 2% (20 MG/ML) 5 ML SYRINGE
INTRAMUSCULAR | Status: DC | PRN
  Administered 2020-05-04: 30 mg via INTRAVENOUS

## 2020-05-04 SURGICAL SUPPLY — 83 items
APL PRP STRL LF DISP 70% ISPRP (MISCELLANEOUS) ×1
BANDAGE ESMARK 6X9 LF (GAUZE/BANDAGES/DRESSINGS) IMPLANT
BIT DRILL 1.5X85 (BIT) ×1 IMPLANT
BIT DRILL CANN 2.4 (BIT) ×2
BIT DRILL CANN MAX VPC 2.4 (BIT) IMPLANT
BLADE AVERAGE 25X9 (BLADE) IMPLANT
BLADE LONG MED 25X9 (BLADE) ×2 IMPLANT
BLADE MICRO SAGITTAL (BLADE) IMPLANT
BLADE OSC/SAG .038X5.5 CUT EDG (BLADE) IMPLANT
BLADE SURG 15 STRL LF DISP TIS (BLADE) ×3 IMPLANT
BLADE SURG 15 STRL SS (BLADE) ×6
BNDG CMPR 9X4 STRL LF SNTH (GAUZE/BANDAGES/DRESSINGS) ×1
BNDG CMPR 9X6 STRL LF SNTH (GAUZE/BANDAGES/DRESSINGS)
BNDG COHESIVE 4X5 TAN STRL (GAUZE/BANDAGES/DRESSINGS) ×2 IMPLANT
BNDG COHESIVE 6X5 TAN STRL LF (GAUZE/BANDAGES/DRESSINGS) IMPLANT
BNDG CONFORM 3 STRL LF (GAUZE/BANDAGES/DRESSINGS) ×2 IMPLANT
BNDG ESMARK 4X9 LF (GAUZE/BANDAGES/DRESSINGS) ×1 IMPLANT
BNDG ESMARK 6X9 LF (GAUZE/BANDAGES/DRESSINGS)
CHLORAPREP W/TINT 26 (MISCELLANEOUS) ×2 IMPLANT
COVER BACK TABLE 60X90IN (DRAPES) ×2 IMPLANT
COVER WAND RF STERILE (DRAPES) IMPLANT
CUFF TOURN SGL QUICK 24 (TOURNIQUET CUFF) ×2
CUFF TOURN SGL QUICK 34 (TOURNIQUET CUFF)
CUFF TRNQT CYL 24X4X16.5-23 (TOURNIQUET CUFF) IMPLANT
CUFF TRNQT CYL 34X4.125X (TOURNIQUET CUFF) IMPLANT
DRAPE EXTREMITY T 121X128X90 (DISPOSABLE) ×2 IMPLANT
DRAPE OEC MINIVIEW 54X84 (DRAPES) ×2 IMPLANT
DRAPE U-SHAPE 47X51 STRL (DRAPES) ×2 IMPLANT
DRSG MEPITEL 4X7.2 (GAUZE/BANDAGES/DRESSINGS) ×2 IMPLANT
DRSG PAD ABDOMINAL 8X10 ST (GAUZE/BANDAGES/DRESSINGS) ×2 IMPLANT
ELECT REM PT RETURN 9FT ADLT (ELECTROSURGICAL) ×2
ELECTRODE REM PT RTRN 9FT ADLT (ELECTROSURGICAL) ×1 IMPLANT
GAUZE SPONGE 4X4 12PLY STRL (GAUZE/BANDAGES/DRESSINGS) ×2 IMPLANT
GLOVE SRG 8 PF TXTR STRL LF DI (GLOVE) ×2 IMPLANT
GLOVE SURG ENC MOIS LTX SZ8 (GLOVE) ×2 IMPLANT
GLOVE SURG LTX SZ8 (GLOVE) ×2 IMPLANT
GLOVE SURG SYN 7.0 (GLOVE) ×2 IMPLANT
GLOVE SURG SYN 7.0 PF PI (GLOVE) IMPLANT
GLOVE SURG UNDER POLY LF SZ7 (GLOVE) ×2 IMPLANT
GLOVE SURG UNDER POLY LF SZ8 (GLOVE) ×4
GOWN STRL REUS W/ TWL LRG LVL3 (GOWN DISPOSABLE) ×1 IMPLANT
GOWN STRL REUS W/ TWL XL LVL3 (GOWN DISPOSABLE) ×2 IMPLANT
GOWN STRL REUS W/TWL LRG LVL3 (GOWN DISPOSABLE) ×2
GOWN STRL REUS W/TWL XL LVL3 (GOWN DISPOSABLE) ×4
K-WIRE .054X4 (WIRE) ×1 IMPLANT
K-WIRE COCR 1.1X105 (WIRE) ×2
KWIRE COCR 1.1X105 (WIRE) IMPLANT
NDL HYPO 25X1 1.5 SAFETY (NEEDLE) IMPLANT
NEEDLE HYPO 22GX1.5 SAFETY (NEEDLE) IMPLANT
NEEDLE HYPO 25X1 1.5 SAFETY (NEEDLE) IMPLANT
NS IRRIG 1000ML POUR BTL (IV SOLUTION) ×2 IMPLANT
PACK BASIN DAY SURGERY FS (CUSTOM PROCEDURE TRAY) ×2 IMPLANT
PAD CAST 4YDX4 CTTN HI CHSV (CAST SUPPLIES) ×1 IMPLANT
PADDING CAST COTTON 4X4 STRL (CAST SUPPLIES) ×2
PADDING CAST COTTON 6X4 STRL (CAST SUPPLIES) IMPLANT
PENCIL SMOKE EVACUATOR (MISCELLANEOUS) ×2 IMPLANT
SANITIZER HAND PURELL 535ML FO (MISCELLANEOUS) ×2 IMPLANT
SCREW CORT ST MINI HEX 2X18 (Screw) ×1 IMPLANT
SCREW CORT ST MINI HEX 2X22 (Screw) ×1 IMPLANT
SCREW CORTICAL ST2.0X24 (Screw) ×1 IMPLANT
SCREW HCS TWIST-OFF 2.0X12MM (Screw) ×1 IMPLANT
SCREW HCS TWIST-OFF 2.0X14MM (Screw) ×1 IMPLANT
SCREW VPC 3.4X16 (Screw) ×1 IMPLANT
SHEET MEDIUM DRAPE 40X70 STRL (DRAPES) ×2 IMPLANT
SLEEVE SCD COMPRESS KNEE MED (STOCKING) ×2 IMPLANT
SPLINT FAST PLASTER 5X30 (CAST SUPPLIES)
SPLINT PLASTER CAST FAST 5X30 (CAST SUPPLIES) IMPLANT
SPONGE LAP 18X18 RF (DISPOSABLE) ×2 IMPLANT
STOCKINETTE 6  STRL (DRAPES) ×2
STOCKINETTE 6 STRL (DRAPES) ×1 IMPLANT
SUCTION FRAZIER HANDLE 10FR (MISCELLANEOUS) ×2
SUCTION TUBE FRAZIER 10FR DISP (MISCELLANEOUS) ×1 IMPLANT
SUT ETHILON 3 0 PS 1 (SUTURE) ×3 IMPLANT
SUT MNCRL AB 3-0 PS2 18 (SUTURE) ×2 IMPLANT
SUT VIC AB 0 SH 27 (SUTURE) IMPLANT
SUT VIC AB 2-0 SH 27 (SUTURE) ×2
SUT VIC AB 2-0 SH 27XBRD (SUTURE) IMPLANT
SUT VICRYL 0 UR6 27IN ABS (SUTURE) ×1 IMPLANT
SYR BULB EAR ULCER 3OZ GRN STR (SYRINGE) ×2 IMPLANT
SYR CONTROL 10ML LL (SYRINGE) IMPLANT
TOWEL GREEN STERILE FF (TOWEL DISPOSABLE) ×4 IMPLANT
TUBE CONNECTING 20X1/4 (TUBING) ×2 IMPLANT
UNDERPAD 30X36 HEAVY ABSORB (UNDERPADS AND DIAPERS) ×2 IMPLANT

## 2020-05-04 NOTE — Anesthesia Procedure Notes (Signed)
Anesthesia Regional Block: Adductor canal block   Pre-Anesthetic Checklist: ,, timeout performed, Correct Patient, Correct Site, Correct Laterality, Correct Procedure, Correct Position, site marked, Risks and benefits discussed,  Surgical consent,  Pre-op evaluation,  At surgeon's request and post-op pain management  Laterality: Right  Prep: chloraprep       Needles:  Injection technique: Single-shot  Needle Type: Echogenic Stimulator Needle     Needle Length: 9cm  Needle Gauge: 21   Needle insertion depth: 6 cm   Additional Needles:   Procedures:,,,, ultrasound used (permanent image in chart),,,,  Narrative:  Start time: 05/04/2020 7:10 AM End time: 05/04/2020 7:14 AM Injection made incrementally with aspirations every 5 mL.  Performed by: Personally  Anesthesiologist: Josephine Igo, MD  Additional Notes: Timeout performed. Patient sedated. Relevant anatomy ID'd using [place]. Incremental 2-58ml injection of LA with frequent aspiration. Patient tolerated procedure well.        Right Adductor Canal Block

## 2020-05-04 NOTE — Progress Notes (Signed)
Assisted Dr. Royce Macadamia with right, ultrasound guided, popliteal, adductor canal block. Side rails up, monitors on throughout procedure. See vital signs in flow sheet. Tolerated Procedure well.

## 2020-05-04 NOTE — Anesthesia Postprocedure Evaluation (Signed)
Anesthesia Post Note  Patient: Jimmy Barry  Procedure(s) Performed: Right First Metatarsal Scarf/Akin; Modified Mcbride, Second and Third Metatarsal Weil and Second Hammertoe Correction (Right Foot)     Patient location during evaluation: PACU Anesthesia Type: Regional and MAC Level of consciousness: awake and alert Pain management: pain level controlled Vital Signs Assessment: post-procedure vital signs reviewed and stable Respiratory status: spontaneous breathing, nonlabored ventilation and respiratory function stable Cardiovascular status: stable and blood pressure returned to baseline Postop Assessment: no apparent nausea or vomiting Anesthetic complications: no   No complications documented.  Last Vitals:  Vitals:   05/04/20 0900 05/04/20 0915  BP: 129/64 112/68  Pulse: 71 73  Resp: 12 14  Temp: 36.4 C   SpO2: 100% 98%    Last Pain:  Vitals:   05/04/20 0915  TempSrc:   PainSc: 0-No pain                 Ethon Wymer A.

## 2020-05-04 NOTE — Op Note (Signed)
05/04/2020  9:15 AM  PATIENT:  Jimmy Barry  70 y.o. male  PRE-OPERATIVE DIAGNOSIS: 1.  Painful right foot bunion deformity 2.  Right forefoot metatarsalgia 3.  Right second and third hammertoe deformities  POST-OPERATIVE DIAGNOSIS: Same  Procedure(s): 1.  Correction of right foot bunion deformity with double osteotomy (scarf, Akin) 2.  Right second and third metatarsal Weil osteotomies 3.  Repair of right second MTP joint lateral collateral ligament 4.  Right second hammertoe correction with PIP arthrodesis.  #5 right third hammertoe correction with dorsal capsulotomy and extensor tendon lengthening 5.  AP and lateral radiographs of the right foot  SURGEON:  Wylene Simmer, MD  ASSISTANT: Mechele Claude, PA-C  ANESTHESIA:   General, regional  EBL:  minimal   TOURNIQUET:   Total Tourniquet Time Documented: Calf (Right) - 9 minutes Calf (Right) - 61 minutes Total: Calf (Right) - 70 minutes  COMPLICATIONS:  None apparent  DISPOSITION:  Extubated, awake and stable to recovery.  INDICATION FOR PROCEDURE: The patient is a 70 year old male with past medical history significant for diabetes and coronary artery disease.  He has a long history of right forefoot pain due to a prominent bunion deformity, metatarsalgia and second and third hammertoe deformities.  He has failed nonoperative treatment to date including activity modification, oral anti-inflammatories and shoewear modification.  He presents now for operative treatment of these painful right forefoot deformities.  The risks and benefits of the alternative treatment options have been discussed in detail.  The patient wishes to proceed with surgery and specifically understands risks of bleeding, infection, nerve damage, blood clots, need for additional surgery, amputation and death.  PROCEDURE IN DETAIL: After preoperative consent was obtained and the correct operative site was identified, the patient was brought to the operating room  and placed upon the operating table.  IV sedation was administered following regional anesthesia.  Preoperative antibiotics were administered.  A surgical timeout was taken.  The right lower extremity was prepped and draped in standard sterile fashion with a tourniquet around the calf.  The extremity was exsanguinated and the tourniquet was inflated to 250 mmHg.  A longitudinal incision was made at the dorsum of the first webspace.  Dissection was carried sharply down through the subcutaneous tissues.  The intermetatarsal ligament was divided under direct vision.  An arthrotomy was made between the lateral sesamoid and the first metatarsal head.  The hallux could then be positioned in 20 degrees of varus.  A longitudinal incision was made along the medial aspect of the first ray.  Dissection was carried sharply down through the subcutaneous tissues.  The medial joint capsule was incised and elevated dorsally and plantarly.  Hypertrophic medial eminence was resected with the oscillating saw.  A scarf osteotomy was then cut in the first metatarsal marking the corners with K wires.  A small wedge of bone was removed proximally.  The head of the metatarsal was translated laterally correcting the intermetatarsal angles and hallux valgus angles.  The osteotomy was fixed with 2 [employer] stainless steel 2 mm mini frag screws.  Overhanging bone was trimmed with a rondure and oscillating saw.  Dissection was then carried distally along the base of the proximal phalanx.  A closing wedge osteotomy was made medially correcting the residual hallux valgus interphalangeus.  The osteotomy was fixed with a 2 mm screw.  Attention was turned to the second webspace where a longitudinal incision was made.  Dissection was carried down to the joint capsule.  The  extensor tendons were lengthened.  The capsule was incised and elevated medially and laterally.  A Weil osteotomy was made with the oscillating saw and fixed with a 2 mm  FRS screw.  Attention was turned to the third MTP joint where the extensor tendons were lengthened and the Weil osteotomy made as described above.  The extensor tendon lengthening and dorsal capsulotomy corrected the hammertoe deformity at the third toe appropriately.  The second toe was noted to have a persistent fixed hammertoe deformity.  A transverse incision was made over the PIP joint.  Dissection was carried sharply down through the subcutaneous tissues and extensor mechanism.  The head of the proximal phalanx was resected followed by the base of the middle phalanx.  The joint was reduced and fixed with a 3.4 mm Zimmer [employer] VPC screw.  The second toe was noted to still deviate slightly towards the hallux.  The lateral collateral ligament was noted to be incompetent.  It was reefed and repaired with a 0 Vicryl box suture.  AP and lateral radiographs confirmed appropriate position and length of all hardware and appropriate correction of the forefoot deformities.  The wounds were then irrigated copiously.  The medial joint capsule was repaired with imbricating sutures of 2-0 Vicryl.  Subcutaneous tissues were sprinkled with vancomycin powder.  extensor tendons were repaired with Vicryl.  Subcutaneous tissues were approximated with Monocryl.  Skin incisions were closed with nylon.  Sterile dressings were applied followed by a bunion wrap and a compression dressing.  The tourniquet was released after application of the dressings.  The patient was awakened from anesthesia and transported to the recovery room in stable condition.   FOLLOW UP PLAN: Weightbearing as tolerated on the heel in a Darco shoe.  Follow-up in the office in 2 weeks for suture removal and use of a toe spacer.  Plan 6 weeks postop immobilization.   RADIOGRAPHS: AP and lateral radiographs of the right foot are obtained intraoperatively.  These show interval correction of the bunion, second and third hammertoe deformities.  Hardware is  appropriately positioned and of the appropriate lengths.  No other acute injuries are noted.    Mechele Claude PA-C was present and scrubbed for the duration of the operative case. His assistance was essential in positioning the patient, prepping and draping, gaining and maintaining exposure, performing the operation, closing and dressing the wounds and applying the splint.

## 2020-05-04 NOTE — Transfer of Care (Signed)
Immediate Anesthesia Transfer of Care Note  Patient: Jimmy Barry  Procedure(s) Performed: Right First Metatarsal Scarf/Akin; Modified Mcbride, Second and Third Metatarsal Weil and Second Hammertoe Correction (Right Foot)  Patient Location: PACU  Anesthesia Type:MAC combined with regional for post-op pain  Level of Consciousness: awake, alert , oriented and patient cooperative  Airway & Oxygen Therapy: Patient Spontanous Breathing and Patient connected to face mask oxygen  Post-op Assessment: Report given to RN and Post -op Vital signs reviewed and stable  Post vital signs: Reviewed and stable  Last Vitals:  Vitals Value Taken Time  BP    Temp    Pulse 71 05/04/20 0900  Resp    SpO2 100 % 05/04/20 0900  Vitals shown include unvalidated device data.  Last Pain:  Vitals:   05/04/20 0638  TempSrc: Oral  PainSc: 2       Patients Stated Pain Goal: 5 (01/00/71 2197)  Complications: No complications documented.

## 2020-05-04 NOTE — Anesthesia Procedure Notes (Signed)
Procedure Name: MAC Date/Time: 05/04/2020 7:48 AM Performed by: Signe Colt, CRNA Pre-anesthesia Checklist: Patient identified, Emergency Drugs available, Suction available, Patient being monitored and Timeout performed Patient Re-evaluated:Patient Re-evaluated prior to induction Oxygen Delivery Method: Simple face mask

## 2020-05-04 NOTE — Anesthesia Procedure Notes (Signed)
Anesthesia Regional Block: Popliteal block   Pre-Anesthetic Checklist: ,, timeout performed, Correct Patient, Correct Site, Correct Laterality, Correct Procedure, Correct Position, site marked, Risks and benefits discussed,  Surgical consent,  Pre-op evaluation,  At surgeon's request and post-op pain management  Laterality: Right  Prep: chloraprep       Needles:  Injection technique: Single-shot  Needle Type: Echogenic Stimulator Needle     Needle Length: 9cm  Needle Gauge: 21   Needle insertion depth: 6 cm   Additional Needles:   Narrative:  Start time: 05/04/2020 7:05 AM End time: 05/04/2020 7:10 AM Injection made incrementally with aspirations every 5 mL.  Performed by: Personally  Anesthesiologist: Josephine Igo, MD  Additional Notes: Timeout performed. Patient sedated. Relevant anatomy ID'd using [place]. Incremental 2-81ml injection of LA with frequent aspiration. Patient tolerated procedure well.        Right Popliteal Block

## 2020-05-04 NOTE — H&P (Signed)
Jimmy Barry is an 70 y.o. male.   Chief Complaint: right forefoot pain HPI: The patient is a 70 year old male with a past medical history significant for coronary artery disease.  He has a long history of right forefoot pain due to a prominent bunion deformity and second hammertoe with metatarsalgia.  He has failed nonoperative treatment to date and presents today for surgical correction of these painful forefoot conditions.  He has been cleared by his cardiologist.  Past Medical History:  Diagnosis Date  . Angina at rest [facility]) week of 2/8 2021  . Atrial flutter ([place]) 03/2018   Coronary Ablation   . CAD (coronary artery disease)   . 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 CHF (congestive heart failure) ([place])   . Diabetes mellitus without complication ([place])   . Dysrhythmia    atrial fib  . GERD (gastroesophageal reflux disease)   . Headache    migraines 3-4x/month  . HOH (hard of hearing)    has aides, doesn't wear  . Hypercholesteremia   . Hypertension   . Hypothyroidism   . Myocardial infarction ([place]) 2007  . Neuropathy   . Sleep apnea    does not wear CPAP  . Wears dentures    full upper and lower    Past Surgical History:  Procedure Laterality Date  . APPENDECTOMY    . CARDIAC CATHETERIZATION  2016   stents  . CATARACT EXTRACTION W/PHACO Right 01/01/2019   Procedure: CATARACT EXTRACTION PHACO AND INTRAOCULAR LENS PLACEMENT (IOC)RIGHT DIABETIC;  Surgeon: Birder Robson, MD;  Location: [facility] ORS;  Service: Ophthalmology;  Laterality: Right;  [place] 01:02.5 CDE 7.36 Fluid Pack Lot # [id number]  . CATARACT EXTRACTION W/PHACO Left 02/11/2019   Procedure: CATARACT EXTRACTION PHACO AND INTRAOCULAR LENS PLACEMENT ([place]) LEFT DIABETIC;  Surgeon: Birder Robson, MD;  Location: [facility] ORS;  Service: Ophthalmology;  Laterality: Left;  [place] 00:41.0 CDE 5.61 Fluid Pack lot # [id number]  . CHOLECYSTECTOMY    . COLON RESECTION  1994   perforated bowel after first chemo  . COLON SURGERY      colon resection with colostomy and later reversal  . CORONARY ARTERY BYPASS GRAFT  2017  . KNEE ARTHROSCOPY    . TONSILLECTOMY      History reviewed. No pertinent family history. Social History:  reports that he has never smoked. He quit smokeless tobacco use about 28 years ago.  His smokeless tobacco use included chew. He reports that he does not drink alcohol and does not use drugs.  Allergies:  Allergies  Allergen Reactions  . Compazine [Prochlorperazine]     Nervous, racing feeling  . Morphine And Related     Felt like head would explode  . Penicillins Swelling    Did it involve swelling of the face/tongue/throat, SOB, or low BP? Yes Did it involve sudden or severe rash/hives, skin peeling, or any reaction on the inside of your mouth or nose? No Did you need to seek medical attention at a hospital or doctor's office? Yes When did it last happen?18 months  If all above answers are "NO", may proceed with cephalosporin use.   . Reglan [Metoclopramide]     Nervous, racing feeling  . Sulfa Antibiotics     Denies current issues(11/30/18)    Medications Prior to Admission  Medication Sig Dispense Refill  . aspirin EC 81 MG tablet Take 81 mg by mouth daily.    . baclofen (LIORESAL) 10 MG tablet Take 10 mg by mouth as needed  for muscle spasms.    . dabigatran (PRADAXA) 150 MG CAPS capsule Take 150 mg by mouth 2 (two) times daily.    . empagliflozin (JARDIANCE) 10 MG TABS tablet Take 10 mg by mouth daily.    Eduard Roux (AIMOVIG) 140 MG/ML SOAJ Inject 140 mg into the skin every 30 (thirty) days.     . ergocalciferol (VITAMIN D2) 1.25 MG (50000 UT) capsule Take 50,000 Units by mouth once a week. Sunday    . fluticasone (FLONASE) 50 MCG/ACT nasal spray Place 1 spray into both nostrils daily as needed for allergies.     Marland Kitchen gabapentin (NEURONTIN) 300 MG capsule Take 300 mg by mouth 3 (three) times daily. 3 capsules in am 4 capsules midday 4 capsules in evening    . glipiZIDE  (GLUCOTROL) 10 MG tablet Take 10 mg by mouth daily before breakfast.    . isosorbide mononitrate (IMDUR) 60 MG 24 hr tablet Take 60 mg by mouth 2 (two) times daily.     Marland Kitchen levothyroxine (SYNTHROID) 100 MCG tablet Take 100 mcg by mouth every other day.     . levothyroxine (SYNTHROID) 88 MCG tablet Take 88 mcg by mouth See admin instructions. Thurs - Sat    . metFORMIN (GLUCOPHAGE) 1000 MG tablet Take 1,000 mg by mouth 2 (two) times daily with a meal.    . metoprolol succinate (TOPROL-XL) 25 MG 24 hr tablet Take 12.5 mg by mouth 2 (two) times daily.     Marland Kitchen omeprazole (PRILOSEC) 40 MG capsule Take 40 mg by mouth 2 (two) times daily.     . polyethylene glycol (MIRALAX / GLYCOLAX) 17 g packet Take 17 g by mouth 5 (five) times daily.     . promethazine (PHENERGAN) 25 MG tablet Take 25 mg by mouth every 6 (six) hours as needed for nausea or vomiting.    . ranolazine (RANEXA) 1000 MG SR tablet Take 500 mg by mouth 2 (two) times daily.    . rosuvastatin (CRESTOR) 40 MG tablet Take 40 mg by mouth at bedtime.     Marland Kitchen testosterone cypionate (DEPOTESTOSTERONE CYPIONATE) 200 MG/ML injection Inject 300 mg into the muscle every 14 (fourteen) days. 1.5 mL    . nitroGLYCERIN (NITROSTAT) 0.4 MG SL tablet Place 0.4 mg under the tongue every 5 (five) minutes as needed for chest pain.      Results for orders placed or performed during the hospital encounter of 05/04/20 (from the past 48 hour(s))  Basic metabolic panel per protocol     Status: Abnormal   Collection Time: 05/02/20  1:02 PM  Result Value Ref Range   Sodium 132 (L) 135 - 145 mmol/L   Potassium 4.5 3.5 - 5.1 mmol/L   Chloride 101 98 - 111 mmol/L   CO2 21 (L) 22 - 32 mmol/L   Glucose, Bld 164 (H) 70 - 99 mg/dL    Comment: Glucose reference range applies only to samples taken after fasting for at least 8 hours.   BUN 20 8 - 23 mg/dL   Creatinine, Ser 1.00 0.61 - 1.24 mg/dL   Calcium 8.8 (L) 8.9 - 10.3 mg/dL   GFR, Estimated >60 >60 mL/min    Comment:  (NOTE) Calculated using the CKD-EPI Creatinine Equation (2021)    Anion gap 10 5 - 15    Comment: Performed at [facility] [address], [place], [place] [address]   No results found.  Review of Systems  No recent f/c/n/v/wt loss  Blood pressure 133/77,  pulse 76, temperature 98.1 F (36.7 C), temperature source Oral, resp. rate 14, height 5\' 10"  (1.778 m), weight 73.6 kg, SpO2 100 %. Physical Exam  Well-nourished well-developed and in no apparent distress.  Alert and oriented x4.  Normal mood and affect.  Gait is normal.  On standing exam he has a prominent bunion deformity as well as a second hammertoe.  Third hammertoe is flexible.  Skin is healthy and intact.  Pulses are palpable in the foot.  No lymphadenopathy.  Intact sensibility to light touch dorsally and plantarly at the forefoot.   Assessment/Plan Right foot painful bunion deformity, second hammertoe deformity and metatarsalgia -to the operating room today for scarf osteotomy and modified McBride bunionectomy with possible Akin osteotomy.  He will also need second metatarsal Weil osteotomy and hammertoe correction and possible third hammertoe correction.The risks and benefits of the alternative treatment options have been discussed in detail.  The patient wishes to proceed with surgery and specifically understands risks of bleeding, infection, nerve damage, blood clots, need for additional surgery, amputation and death.   Wylene Simmer, MD 05-29-2020, 7:21 AM

## 2020-05-04 NOTE — Discharge Instructions (Addendum)
Jimmy Hewitt, MD [facility]  Please read the following information regarding your care after surgery.  Medications  You only need a prescription for the narcotic pain medicine (ex. oxycodone, Percocet, Norco).  All of the other medicines listed below are available over the counter. X Aleve 2 pills twice a day for the first 3 days after surgery. X acetominophen (Tylenol) 650 mg every 4-6 hours as you need for minor to moderate pain X oxycodone as prescribed for severe pain  Narcotic pain medicine (ex. oxycodone, Percocet, Vicodin) will cause constipation.  To prevent this problem, take the following medicines while you are taking any pain medicine. X docusate sodium (Colace) 100 mg twice a day X senna (Senokot) 2 tablets twice a day  Weight Bearing X Bear weight only on your operated foot in the post-op shoe.   Cast / Splint / Dressing X Keep your splint, cast or dressing clean and dry.  Don't put anything (coat hanger, pencil, etc) down inside of it.  If it gets damp, use a hair dryer on the cool setting to dry it.  If it gets soaked, call the office to schedule an appointment for a cast change.   After your dressing, cast or splint is removed; you may shower, but do not soak or scrub the wound.  Allow the water to run over it, and then gently pat it dry.  Swelling It is normal for you to have swelling where you had surgery.  To reduce swelling and pain, keep your toes above your nose for at least 3 days after surgery.  It may be necessary to keep your foot or leg elevated for several weeks.  If it hurts, it should be elevated.  Follow Up Call my office at [phone] when you are discharged from the hospital or surgery center to schedule an appointment to be seen two weeks after surgery.  Call my office at [phone] if you develop a fever >101.5 F, nausea, vomiting, bleeding from the surgical site or severe pain.     Post Anesthesia Home Care Instructions  Activity: Get  plenty of rest for the remainder of the day. A responsible individual must stay with you for 24 hours following the procedure.  For the next 24 hours, DO NOT: -Drive a car -Operate machinery -Drink alcoholic beverages -Take any medication unless instructed by your physician -Make any legal decisions or sign important papers.  Meals: Start with liquid foods such as gelatin or soup. Progress to regular foods as tolerated. Avoid greasy, spicy, heavy foods. If nausea and/or vomiting occur, drink only clear liquids until the nausea and/or vomiting subsides. Call your physician if vomiting continues.  Special Instructions/Symptoms: Your throat may feel dry or sore from the anesthesia or the breathing tube placed in your throat during surgery. If this causes discomfort, gargle with warm salt water. The discomfort should disappear within 24 hours.  If you had a scopolamine patch placed behind your ear for the management of post- operative nausea and/or vomiting:  1. The medication in the patch is effective for 72 hours, after which it should be removed.  Wrap patch in a tissue and discard in the trash. Wash hands thoroughly with soap and water. 2. You may remove the patch earlier than 72 hours if you experience unpleasant side effects which may include dry mouth, dizziness or visual disturbances. 3. Avoid touching the patch. Wash your hands with soap and water after contact with the patch.  Regional Anesthesia Blocks  1. Numbness or   the inability to move the "blocked" extremity may last from 3-48 hours after placement. The length of time depends on the medication injected and your individual response to the medication. If the numbness is not going away after 48 hours, call your surgeon.  2. The extremity that is blocked will need to be protected until the numbness is gone and the  Strength has returned. Because you cannot feel it, you will need to take extra care to avoid injury. Because it may be  weak, you may have difficulty moving it or using it. You may not know what position it is in without looking at it while the block is in effect.  3. For blocks in the legs and feet, returning to weight bearing and walking needs to be done carefully. You will need to wait until the numbness is entirely gone and the strength has returned. You should be able to move your leg and foot normally before you try and bear weight or walk. You will need someone to be with you when you first try to ensure you do not fall and possibly risk injury.  4. Bruising and tenderness at the needle site are common side effects and will resolve in a few days.  5. Persistent numbness or new problems with movement should be communicated to the surgeon or the [facility] ([phone])/  Surgery Center ([phone]).        

## 2020-05-05 ENCOUNTER — Encounter (HOSPITAL_BASED_OUTPATIENT_CLINIC_OR_DEPARTMENT_OTHER): Payer: Self-pay | Admitting: Orthopedic Surgery
